# Patient Record
Sex: Female | Born: 1990 | Race: Black or African American | Hispanic: No | Smoking: Never smoker
Health system: Southern US, Community
[De-identification: ages and names within clinical notes are randomized; demographics above are authoritative.]

## PROBLEM LIST (undated history)

## (undated) ENCOUNTER — Inpatient Hospital Stay (HOSPITAL_COMMUNITY): Payer: Self-pay

## (undated) DIAGNOSIS — O039 Complete or unspecified spontaneous abortion without complication: Secondary | ICD-10-CM

## (undated) DIAGNOSIS — R030 Elevated blood-pressure reading, without diagnosis of hypertension: Secondary | ICD-10-CM

## (undated) DIAGNOSIS — Z789 Other specified health status: Secondary | ICD-10-CM

## (undated) HISTORY — DX: Elevated blood-pressure reading, without diagnosis of hypertension: R03.0

## (undated) HISTORY — PX: WISDOM TOOTH EXTRACTION: SHX21

## (undated) HISTORY — DX: Complete or unspecified spontaneous abortion without complication: O03.9

---
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
## 2010-04-26 ENCOUNTER — Inpatient Hospital Stay (HOSPITAL_COMMUNITY): Admission: AD | Admit: 2010-04-26 | Discharge: 2010-04-26 | Payer: Self-pay | Admitting: Obstetrics and Gynecology

## 2010-05-13 ENCOUNTER — Emergency Department (HOSPITAL_BASED_OUTPATIENT_CLINIC_OR_DEPARTMENT_OTHER): Admission: EM | Admit: 2010-05-13 | Discharge: 2010-05-13 | Payer: Self-pay | Admitting: Emergency Medicine

## 2010-06-19 ENCOUNTER — Inpatient Hospital Stay (HOSPITAL_COMMUNITY): Admission: AD | Admit: 2010-06-19 | Discharge: 2010-06-20 | Payer: Self-pay | Admitting: Obstetrics and Gynecology

## 2010-06-19 ENCOUNTER — Ambulatory Visit: Payer: Self-pay | Admitting: Family

## 2010-10-09 ENCOUNTER — Emergency Department (HOSPITAL_BASED_OUTPATIENT_CLINIC_OR_DEPARTMENT_OTHER)
Admission: EM | Admit: 2010-10-09 | Discharge: 2010-10-09 | Payer: Self-pay | Source: Home / Self Care | Admitting: Emergency Medicine

## 2010-10-27 ENCOUNTER — Inpatient Hospital Stay (HOSPITAL_COMMUNITY)
Admission: AD | Admit: 2010-10-27 | Discharge: 2010-10-27 | Payer: 59 | Source: Home / Self Care | Attending: Obstetrics and Gynecology | Admitting: Obstetrics and Gynecology

## 2010-10-27 LAB — URINALYSIS, ROUTINE W REFLEX MICROSCOPIC
Bilirubin Urine: NEGATIVE
Hemoglobin, Urine: NEGATIVE
Ketones, ur: NEGATIVE mg/dL
Nitrite: NEGATIVE
Protein, ur: NEGATIVE mg/dL
Specific Gravity, Urine: 1.015 (ref 1.005–1.030)
Urine Glucose, Fasting: NEGATIVE mg/dL
Urobilinogen, UA: 0.2 mg/dL (ref 0.0–1.0)
pH: 7 (ref 5.0–8.0)

## 2010-11-08 ENCOUNTER — Inpatient Hospital Stay (HOSPITAL_COMMUNITY)
Admission: AD | Admit: 2010-11-08 | Discharge: 2010-11-08 | Payer: Self-pay | Source: Home / Self Care | Attending: Obstetrics and Gynecology | Admitting: Obstetrics and Gynecology

## 2010-11-25 ENCOUNTER — Inpatient Hospital Stay (HOSPITAL_COMMUNITY)
Admission: AD | Admit: 2010-11-25 | Discharge: 2010-11-25 | Disposition: A | Discharge status: Home / Self Care | Payer: 59 | Source: Ambulatory Visit | Attending: Obstetrics and Gynecology | Admitting: Obstetrics and Gynecology

## 2010-11-25 DIAGNOSIS — O4700 False labor before 37 completed weeks of gestation, unspecified trimester: Secondary | ICD-10-CM | POA: Insufficient documentation

## 2010-11-29 ENCOUNTER — Inpatient Hospital Stay (HOSPITAL_COMMUNITY)
Admission: AD | Admit: 2010-11-29 | Discharge: 2010-11-30 | Disposition: A | Discharge status: Home / Self Care | Payer: 59 | Source: Ambulatory Visit | Attending: Obstetrics and Gynecology | Admitting: Obstetrics and Gynecology

## 2010-11-29 DIAGNOSIS — O4700 False labor before 37 completed weeks of gestation, unspecified trimester: Secondary | ICD-10-CM | POA: Insufficient documentation

## 2010-11-30 ENCOUNTER — Inpatient Hospital Stay (HOSPITAL_COMMUNITY)
Admission: AD | Admit: 2010-11-30 | Discharge: 2010-12-02 | DRG: 775 | Disposition: A | Discharge status: Home / Self Care | Payer: 59 | Source: Ambulatory Visit | Attending: Obstetrics and Gynecology | Admitting: Obstetrics and Gynecology

## 2010-11-30 LAB — CBC
HCT: 27.7 % — ABNORMAL LOW (ref 36.0–46.0)
Hemoglobin: 9.2 g/dL — ABNORMAL LOW (ref 12.0–15.0)
MCH: 29.3 pg (ref 26.0–34.0)
MCHC: 33.2 g/dL (ref 30.0–36.0)
MCV: 88.2 fL (ref 78.0–100.0)
Platelets: 195 10*3/uL (ref 150–400)
RBC: 3.14 MIL/uL — ABNORMAL LOW (ref 3.87–5.11)
RDW: 12.7 % (ref 11.5–15.5)
WBC: 10.1 10*3/uL (ref 4.0–10.5)

## 2010-12-01 LAB — CBC
HCT: 21.7 % — ABNORMAL LOW (ref 36.0–46.0)
Hemoglobin: 7.3 g/dL — ABNORMAL LOW (ref 12.0–15.0)
MCH: 29.2 pg (ref 26.0–34.0)
MCHC: 33.6 g/dL (ref 30.0–36.0)
MCV: 86.8 fL (ref 78.0–100.0)
Platelets: 157 10*3/uL (ref 150–400)
RBC: 2.5 MIL/uL — ABNORMAL LOW (ref 3.87–5.11)
RDW: 12.7 % (ref 11.5–15.5)
WBC: 15.9 10*3/uL — ABNORMAL HIGH (ref 4.0–10.5)

## 2010-12-01 LAB — RPR: RPR Ser Ql: NONREACTIVE

## 2011-01-06 LAB — URINALYSIS, ROUTINE W REFLEX MICROSCOPIC
Bilirubin Urine: NEGATIVE
Glucose, UA: NEGATIVE mg/dL
Hgb urine dipstick: NEGATIVE
Ketones, ur: NEGATIVE mg/dL
Nitrite: NEGATIVE
Protein, ur: NEGATIVE mg/dL
Specific Gravity, Urine: 1.015 (ref 1.005–1.030)
Urobilinogen, UA: 0.2 mg/dL (ref 0.0–1.0)
pH: 7.5 (ref 5.0–8.0)

## 2011-01-06 LAB — URINE MICROSCOPIC-ADD ON

## 2011-01-07 LAB — BASIC METABOLIC PANEL
BUN: 9 mg/dL (ref 6–23)
CO2: 22 mEq/L (ref 19–32)
Calcium: 9.5 mg/dL (ref 8.4–10.5)
Chloride: 106 mEq/L (ref 96–112)
Creatinine, Ser: 0.7 mg/dL (ref 0.4–1.2)
GFR calc Af Amer: >60 mL/min (ref >60)
GFR calc non Af Amer: >60 mL/min (ref >60)
Glucose, Bld: 78 mg/dL (ref 70–99)
Potassium: 4 mEq/L (ref 3.5–5.1)
Sodium: 141 mEq/L (ref 135–145)

## 2011-01-07 LAB — URINE MICROSCOPIC-ADD ON

## 2011-01-07 LAB — PREGNANCY, URINE: Preg Test, Ur: POSITIVE

## 2011-01-07 LAB — URINALYSIS, ROUTINE W REFLEX MICROSCOPIC
Bilirubin Urine: NEGATIVE
Glucose, UA: NEGATIVE mg/dL
Hgb urine dipstick: NEGATIVE
Ketones, ur: 15 mg/dL — AB
Nitrite: NEGATIVE
Protein, ur: NEGATIVE mg/dL
Specific Gravity, Urine: 1.03 (ref 1.005–1.030)
Urobilinogen, UA: 1 mg/dL (ref 0.0–1.0)
pH: 7 (ref 5.0–8.0)

## 2011-01-08 LAB — URINALYSIS, ROUTINE W REFLEX MICROSCOPIC
Bilirubin Urine: NEGATIVE
Glucose, UA: NEGATIVE mg/dL
Ketones, ur: NEGATIVE mg/dL
Nitrite: NEGATIVE
Protein, ur: NEGATIVE mg/dL
Specific Gravity, Urine: 1.02 (ref 1.005–1.030)
Urobilinogen, UA: 0.2 mg/dL (ref 0.0–1.0)
pH: 7.5 (ref 5.0–8.0)

## 2011-01-08 LAB — URINE CULTURE: Colony Count: 75000

## 2011-01-08 LAB — CBC
HCT: 39.8 % (ref 36.0–46.0)
MCHC: 34 g/dL (ref 30.0–36.0)
MCV: 93.3 fL (ref 78.0–100.0)
Platelets: 184 10*3/uL (ref 150–400)
RDW: 13.2 % (ref 11.5–15.5)
WBC: 3.9 10*3/uL — ABNORMAL LOW (ref 4.0–10.5)

## 2011-01-08 LAB — URINE MICROSCOPIC-ADD ON

## 2011-01-08 LAB — POCT PREGNANCY, URINE: Preg Test, Ur: POSITIVE

## 2011-02-07 LAB — URINALYSIS, ROUTINE W REFLEX MICROSCOPIC
Bilirubin Urine: NEGATIVE
Glucose, UA: NEGATIVE mg/dL
Ketones, ur: NEGATIVE mg/dL
Protein, ur: NEGATIVE mg/dL
pH: 7.5 (ref 5.0–8.0)

## 2011-03-11 ENCOUNTER — Inpatient Hospital Stay (HOSPITAL_COMMUNITY)
Admission: EM | Admit: 2011-03-11 | Discharge: 2011-03-11 | Disposition: A | Discharge status: Home / Self Care | Payer: Medicaid Other | Source: Ambulatory Visit | Attending: Obstetrics and Gynecology | Admitting: Obstetrics and Gynecology

## 2011-03-11 DIAGNOSIS — N938 Other specified abnormal uterine and vaginal bleeding: Secondary | ICD-10-CM

## 2011-03-11 DIAGNOSIS — N949 Unspecified condition associated with female genital organs and menstrual cycle: Secondary | ICD-10-CM

## 2012-08-15 ENCOUNTER — Emergency Department (INDEPENDENT_AMBULATORY_CARE_PROVIDER_SITE_OTHER)
Admission: EM | Admit: 2012-08-15 | Discharge: 2012-08-15 | Disposition: A | Discharge status: Home / Self Care | Payer: BC Managed Care – PPO | Source: Home / Self Care | Attending: Emergency Medicine | Admitting: Emergency Medicine

## 2012-08-15 ENCOUNTER — Encounter (HOSPITAL_COMMUNITY): Payer: Self-pay | Admitting: *Deleted

## 2012-08-15 DIAGNOSIS — J069 Acute upper respiratory infection, unspecified: Secondary | ICD-10-CM

## 2012-08-15 MED ORDER — PSEUDOEPHEDRINE HCL ER 120 MG PO TB12: 120.0000 mg | ORAL_TABLET | Freq: Two times a day (BID) | ORAL | Status: DC

## 2012-08-15 MED ORDER — LORATADINE 10 MG PO TABS: 10.0000 mg | ORAL_TABLET | Freq: Every day | ORAL | Status: DC

## 2012-08-15 NOTE — ED Notes (Signed)
pT  HAS  SYMPTOMS  OF  SORETHROAT  AS  WELL  AS  COUGH  CONGESTION   BODY  ACHES  -  PT  REPORTS  SHE  HAD  SOME  CHILLS  YEST  BUT  NO  FEVER  TODAY      -  SHE  REPORTS  HAD  SYMPTOMS     SEVERAL WEEKS  AGO     GOT  BETTER  THAN RESUMED  3  DAYS  AGO

## 2012-08-15 NOTE — ED Provider Notes (Signed)
History    PCP=[facility], at Urgent care CSN: [id number]  Arrival date & time 08/15/12  6578   First MD Initiated Contact with Patient 08/15/12 [phone]      No chief complaint on file.  HPI 21 yr old female awoke this morning with a sore throat, cough and feeling bad.  It actually started 3 weeks ago, with myalgias, cough, runny nose, fever chills (not recorded)-took OTC robitussin + and Nyquil and this abated.  This returned Mon 21st with similar symptoms-initally a cough.  Has a 21 yr old at home who goes to daycare, but she hasn;t been sick-no one in her care is sick.  Patent works at a call center. Her main concern was that the sore throat was bugginh her.    No past medical history on file.  No past surgical history on file.  No family history on file.  History  Substance Use Topics  . Smoking status: Not on file  . Smokeless tobacco: Not on file  . Alcohol Use: Not on file    OB History    No data available      Review of Systems No diarrhoea, no vomti, +throat pan.,some nasal congestion  + allergies Dec appetite, no vomit,  SOB when she coughs   Allergies  Review of patient's allergies indicates not on file.  Home Medications  No current outpatient prescriptions on file.  There were no vitals taken for this visit.  Physical Exam Alert pleasant oriented African American female Fundus crisp TMs bilaterally are red with clear fluid behind them No submandibular lymphadenopathy however there is some tenderness on the left and right submandibular regions on pressing. Throat is red injected without any tonsillar hypertrophy, good dentition S1-S2 no murmur rub or gallop slightly tachycardic Chest is clinically clear with no tactile vocal resonance or fremitus Abdomen is soft nontender nondistended no rebound bowel sounds are increased no CVA tenderness Motor is grossly intact with good range of motion to all joints   ED Course  Procedures (including critical care  time)  Labs Reviewed - No data to display No results found.   No diagnosis found.    MDM  21 year old [place] American female presents with fever malleus this or throat  Sore throat-cento criteria on this lady is 2-would send cultures as well Have ordered a rapid strep test. If this is positive we will treat with antibiotics-she has a sulfa allergy. I will reassess his with her. She needs to find a primary care physician. 10:50 AM   Her strep test has returned negative, patient requests a note for being out of work for the next day-she requests rendered rhinorrhea medication and decongestant strength  10:51 AM           Rhetta Mura, MD 08/15/12 1051

## 2012-08-16 LAB — STREP A DNA PROBE: Group A Strep Probe: NEGATIVE

## 2012-10-19 ENCOUNTER — Emergency Department (HOSPITAL_COMMUNITY)
Admission: EM | Admit: 2012-10-19 | Discharge: 2012-10-19 | Disposition: A | Discharge status: Home / Self Care | Payer: BC Managed Care – PPO | Attending: Emergency Medicine | Admitting: Emergency Medicine

## 2012-10-19 ENCOUNTER — Encounter (HOSPITAL_COMMUNITY): Payer: Self-pay | Admitting: Emergency Medicine

## 2012-10-19 DIAGNOSIS — R0982 Postnasal drip: Secondary | ICD-10-CM | POA: Insufficient documentation

## 2012-10-19 DIAGNOSIS — R059 Cough, unspecified: Secondary | ICD-10-CM | POA: Insufficient documentation

## 2012-10-19 DIAGNOSIS — R063 Periodic breathing: Secondary | ICD-10-CM | POA: Insufficient documentation

## 2012-10-19 DIAGNOSIS — J019 Acute sinusitis, unspecified: Secondary | ICD-10-CM

## 2012-10-19 DIAGNOSIS — J3489 Other specified disorders of nose and nasal sinuses: Secondary | ICD-10-CM | POA: Insufficient documentation

## 2012-10-19 DIAGNOSIS — J018 Other acute sinusitis: Secondary | ICD-10-CM | POA: Insufficient documentation

## 2012-10-19 DIAGNOSIS — R05 Cough: Secondary | ICD-10-CM | POA: Insufficient documentation

## 2012-10-19 MED ORDER — AMOXICILLIN-POT CLAVULANATE 875-125 MG PO TABS: 1.0000 | ORAL_TABLET | Freq: Two times a day (BID) | ORAL | Status: DC

## 2012-10-19 MED ORDER — HYDROCOD POLST-CHLORPHEN POLST 10-8 MG/5ML PO LQCR: 5.0000 mL | Freq: Two times a day (BID) | ORAL | Status: DC | PRN

## 2012-10-19 NOTE — ED Provider Notes (Signed)
History     CSN: [id number]  Arrival date & time 10/19/12  1919   First MD Initiated Contact with Patient 10/19/12 2128      Chief Complaint  Patient presents with  . Otalgia    (Consider location/radiation/quality/duration/timing/severity/associated sxs/prior treatment) HPI Comments: Patient presents with left sinus pressure, left ear pain, rhinorrhea, nasal congestion, and low grade fever.  She reports that her symptoms have been present for the past 3 days.  She states that she was seen by her PCP for similar symptoms a month ago.  Her symptoms then improved and now have returned.  She has not taken anything prior to arrival in the ED.  She denies HA, sore throat, or body aches.    Patient is a 21 y.o. female presenting with ear pain. The history is provided by the patient.  Otalgia Associated symptoms include rhinorrhea and cough. Pertinent negatives include no sore throat, no abdominal pain, no diarrhea, no vomiting, no neck pain and no rash.    History reviewed. No pertinent past medical history.  History reviewed. No pertinent past surgical history.  Family History  Problem Relation Age of Onset  . Hypertension Other   . Diabetes Other     History  Substance Use Topics  . Smoking status: Never Smoker   . Smokeless tobacco: Not on file  . Alcohol Use: No    OB History    Grav Para Term Preterm Abortions TAB SAB Ect Mult Living                  Review of Systems  Constitutional: Positive for chills.       Low grade fever  HENT: Positive for ear pain, congestion, rhinorrhea, postnasal drip and sinus pressure. Negative for sore throat, trouble swallowing, neck pain and voice change.   Respiratory: Positive for cough. Negative for shortness of breath and wheezing.   Gastrointestinal: Negative for nausea, vomiting, abdominal pain and diarrhea.  Skin: Negative for rash.    Allergies  Sulfa antibiotics  Home Medications   Current Outpatient Rx  Name  Route   Sig  Dispense  Refill  . BROMPHENIRAMINE-PSEUDOEPH 1-15 MG/5ML PO ELIX   Oral   Take 5 mLs by mouth 2 (two) times daily as needed. For congestion or cold symptoms         . PSEUDOEPHEDRINE-GUAIFENESIN ER 60-600 MG PO TB12   Oral   Take 1 tablet by mouth every 12 (twelve) hours as needed. For congestion           BP 141/96  Pulse 104  Temp 99.6 F (37.6 C) (Oral)  Resp 16  SpO2 100%  Physical Exam  Nursing note and vitals reviewed. Constitutional: She appears well-developed and well-nourished. No distress.  HENT:  Head: Normocephalic and atraumatic.  Right Ear: Tympanic membrane and ear canal normal.  Left Ear: Tympanic membrane and ear canal normal.  Nose: Mucosal edema and rhinorrhea present. Right sinus exhibits no maxillary sinus tenderness and no frontal sinus tenderness. Left sinus exhibits maxillary sinus tenderness and frontal sinus tenderness.  Mouth/Throat: Uvula is midline, oropharynx is clear and moist and mucous membranes are normal.  Neck: Normal range of motion. Neck supple.  Cardiovascular: Normal rate, regular rhythm and normal heart sounds.   Pulmonary/Chest: Effort normal and breath sounds normal. No respiratory distress. She has no wheezes. She has no rales.  Neurological: She is alert.  Skin: Skin is warm and dry. No rash noted. She is not diaphoretic.  Psychiatric:  She has a normal mood and affect.    ED Course  Procedures (including critical care time)  Labs Reviewed - No data to display No results found.   No diagnosis found.    MDM  Signs and symptoms consistent with Acute Sinusitis.  Patient discharged home with antibiotic therapy and instructed to take a Decongestant.          Pascal Lux Milfay, PA-C 10/20/12 2021

## 2012-10-19 NOTE — ED Notes (Signed)
Pt is c/o earache on the left side  Pt states pain radiates to her temple and the left side of her face  Pt states pain started 2 to 3 days ago but got worse today  Pt is also c/o cough, productive with yellow sputum

## 2012-10-20 NOTE — ED Provider Notes (Signed)
Medical screening examination/treatment/procedure(s) were performed by non-physician practitioner and as supervising physician I was immediately available for consultation/collaboration.  Marwan T Powers, MD 10/20/12 2222 

## 2013-09-17 ENCOUNTER — Emergency Department (HOSPITAL_COMMUNITY)
Admission: EM | Admit: 2013-09-17 | Discharge: 2013-09-17 | Disposition: A | Discharge status: Home / Self Care | Payer: Self-pay

## 2015-05-29 ENCOUNTER — Encounter (HOSPITAL_COMMUNITY): Payer: Self-pay | Admitting: Family Medicine

## 2015-05-29 ENCOUNTER — Emergency Department (HOSPITAL_COMMUNITY)
Admission: EM | Admit: 2015-05-29 | Discharge: 2015-05-29 | Disposition: A | Discharge status: Home / Self Care | Payer: Self-pay | Attending: Emergency Medicine | Admitting: Emergency Medicine

## 2015-05-29 DIAGNOSIS — R51 Headache: Secondary | ICD-10-CM | POA: Insufficient documentation

## 2015-05-29 DIAGNOSIS — R519 Headache, unspecified: Secondary | ICD-10-CM

## 2015-05-29 MED ORDER — METOCLOPRAMIDE HCL 5 MG/ML IJ SOLN: 10.0000 mg | Freq: Once | INTRAMUSCULAR | Status: DC

## 2015-05-29 MED ORDER — DIPHENHYDRAMINE HCL 50 MG/ML IJ SOLN: 25.0000 mg | Freq: Once | INTRAMUSCULAR | Status: DC

## 2015-05-29 MED ORDER — SODIUM CHLORIDE 0.9 % IV BOLUS (SEPSIS): 1000.0000 mL | Freq: Once | INTRAVENOUS | Status: DC

## 2015-05-29 MED ORDER — IBUPROFEN 400 MG PO TABS
600.0000 mg | ORAL_TABLET | Freq: Once | ORAL | Status: AC
  Administered 2015-05-29: 600 mg via ORAL
  Filled 2015-05-29 (×2): qty 1

## 2015-05-29 MED ORDER — KETOROLAC TROMETHAMINE 30 MG/ML IJ SOLN: 30.0000 mg | Freq: Once | INTRAMUSCULAR | Status: DC

## 2015-05-29 NOTE — ED Notes (Signed)
Pt here for left sided facial pain radiating into head and neck x 1 week.

## 2015-05-29 NOTE — ED Notes (Signed)
The pt does not want the iv meds she wants rxs instaed

## 2015-05-29 NOTE — ED Provider Notes (Signed)
CSN: [id number]     Arrival date & time 05/29/15  1728 History   First MD Initiated Contact with Patient 05/29/15 1903     Chief Complaint  Patient presents with  . Facial Pain     (Consider location/radiation/quality/duration/timing/severity/associated sxs/prior Treatment) HPI  24 year old female presents with intermittent left-sided headache for the past 1 week. She was in a grocery store and had sweating and so a bystander told her she might be having a stroke and told her to go to the ER. The patient's headache has not been worsening over this week and seems to come and go every couple hours. It is a sharp pain that seems to started in her temple and moves diffusely through her left scalp as well as her left face. No facial swelling. No congestion or rhinorrhea. Fevers, blurry vision, nausea, or vomiting. No neck pain or stiffness. Has not taken anything for the pain. Rates her pain as severe currently. No prior history of headaches like this.  History reviewed. No pertinent past medical history. History reviewed. No pertinent past surgical history. Family History  Problem Relation Age of Onset  . Hypertension Other   . Diabetes Other    History  Substance Use Topics  . Smoking status: Never Smoker   . Smokeless tobacco: Not on file  . Alcohol Use: No   OB History    No data available     Review of Systems  Constitutional: Negative for fever.  Eyes: Negative for visual disturbance.  Gastrointestinal: Negative for nausea and vomiting.  Musculoskeletal: Negative for neck pain and neck stiffness.  Neurological: Positive for headaches. Negative for dizziness, weakness and numbness.  All other systems reviewed and are negative.     Allergies  Sulfa antibiotics  Home Medications   Prior to Admission medications   Medication Sig Start Date End Date Taking? Authorizing Provider  amoxicillin-clavulanate (AUGMENTIN) 875-125 MG per tablet Take 1 tablet by mouth 2 (two) times  daily. Patient not taking: Reported on 05/29/2015 10/19/12   Santiago Glad, PA-C  chlorpheniramine-HYDROcodone (TUSSIONEX PENNKINETIC ER) 10-8 MG/5ML LQCR Take 5 mLs by mouth every 12 (twelve) hours as needed. Patient not taking: Reported on 05/29/2015 10/19/12   Heather Laisure, PA-C   BP 106/63 mmHg  Pulse 83  Temp(Src) 98.4 F (36.9 C) (Oral)  Resp 21  Ht 5\' 2"  (1.575 m)  Wt 118 lb (53.524 kg)  BMI 21.58 kg/m2  SpO2 100% Physical Exam  Constitutional: She is oriented to person, place, and time. She appears well-developed and well-nourished.  HENT:  Head: Normocephalic and atraumatic.    Right Ear: External ear normal.  Left Ear: External ear normal.  Nose: Nose normal.  No facial swelling or tenderness  Eyes: Right eye exhibits no discharge. Left eye exhibits no discharge.  Cardiovascular: Normal rate, regular rhythm and normal heart sounds.   Pulmonary/Chest: Effort normal and breath sounds normal.  Abdominal: Soft. There is no tenderness.  Neurological: She is alert and oriented to person, place, and time.  CN 2-12 grossly intact. 5/5 strength in all 4 extremities. Normal gross sensation. Normal finger to nose  Skin: Skin is warm and dry.  Nursing note and vitals reviewed.   ED Course  Procedures (including critical care time) Labs Review Labs Reviewed - No data to display  Imaging Review No results found.   EKG Interpretation None      MDM   Final diagnoses:  Left-sided headache    Patient with an intermittent headache for 1  week. No red flags such as sudden onset, neck stiffness, meningismus, fever, or neuro deficits. Appears quite comfortable at this time. Has diffuse tenderness likely scarring from a musculoskeletal etiology. I highly doubt stroke, subarachnoid hemorrhage, intracranial bleeding, meningitis/encephalitis, or other acute intracranial issue. Discussed treatment in the ER, after initial agreement she does not want IV treatment once ibuprofen and  discharge. I feel this is reasonable, she has a PCP who I recommended she follow-up with closely and may need further testing as an outpatient.    Pricilla Loveless, MD 05/29/15 [phone]

## 2015-07-01 ENCOUNTER — Encounter (HOSPITAL_COMMUNITY): Payer: Self-pay | Admitting: Emergency Medicine

## 2015-07-01 ENCOUNTER — Emergency Department (INDEPENDENT_AMBULATORY_CARE_PROVIDER_SITE_OTHER)
Admission: EM | Admit: 2015-07-01 | Discharge: 2015-07-01 | Disposition: A | Discharge status: Home / Self Care | Payer: Self-pay | Source: Home / Self Care | Attending: Family Medicine | Admitting: Family Medicine

## 2015-07-01 DIAGNOSIS — R6889 Other general symptoms and signs: Secondary | ICD-10-CM

## 2015-07-01 MED ORDER — IBUPROFEN 600 MG PO TABS: 600.0000 mg | ORAL_TABLET | Freq: Four times a day (QID) | ORAL | Status: DC | PRN

## 2015-07-01 MED ORDER — ONDANSETRON HCL 4 MG PO TABS: 8.0000 mg | ORAL_TABLET | Freq: Three times a day (TID) | ORAL | Status: DC | PRN

## 2015-07-01 MED ORDER — OSELTAMIVIR PHOSPHATE 75 MG PO CAPS: 75.0000 mg | ORAL_CAPSULE | Freq: Two times a day (BID) | ORAL | Status: DC

## 2015-07-01 NOTE — ED Provider Notes (Signed)
CSN: [id number]     Arrival date & time 07/01/15  1850 History   First MD Initiated Contact with Patient 07/01/15 1946     Chief Complaint  Patient presents with  . URI   (Consider location/radiation/quality/duration/timing/severity/associated sxs/prior Treatment) HPI  1 day of generalized body aches, fevers, fatigue. Symptoms are constant and getting worse. Has not tried anything for her symptoms. Denies any shortness of breath, chest palpitations, chest pain, nausea, vomiting, diarrhea, constipation, sore throat. Associated with headache.  History reviewed. No pertinent past medical history. History reviewed. No pertinent past surgical history. Family History  Problem Relation Age of Onset  . Hypertension Other   . Diabetes Other    Social History  Substance Use Topics  . Smoking status: Never Smoker   . Smokeless tobacco: None  . Alcohol Use: No   OB History    No data available     Review of Systems Per HPI with all other pertinent systems negative.   Allergies  Sulfa antibiotics  Home Medications   Prior to Admission medications   Medication Sig Start Date End Date Taking? Authorizing Provider  amoxicillin-clavulanate (AUGMENTIN) 875-125 MG per tablet Take 1 tablet by mouth 2 (two) times daily. Patient not taking: Reported on 05/29/2015 10/19/12   Santiago Glad, PA-C  chlorpheniramine-HYDROcodone (TUSSIONEX PENNKINETIC ER) 10-8 MG/5ML LQCR Take 5 mLs by mouth every 12 (twelve) hours as needed. Patient not taking: Reported on 05/29/2015 10/19/12   Santiago Glad, PA-C  ibuprofen (ADVIL,MOTRIN) 600 MG tablet Take 1 tablet (600 mg total) by mouth every 6 (six) hours as needed. 07/01/15   Ozella Rocks, MD  ondansetron (ZOFRAN) 4 MG tablet Take 2 tablets (8 mg total) by mouth every 8 (eight) hours as needed for nausea or vomiting. 07/01/15   Ozella Rocks, MD  oseltamivir (TAMIFLU) 75 MG capsule Take 1 capsule (75 mg total) by mouth 2 (two) times daily. 07/01/15   Ozella Rocks, MD   Meds Ordered and Administered this Visit  Medications - No data to display  BP 124/89 mmHg  Pulse 97  Temp(Src) 99.8 F (37.7 C) (Oral)  Resp 16  SpO2 100% No data found.   Physical Exam Physical Exam  Constitutional: oriented to person, place, and time. appears well-developed and well-nourished. No distress.  HENT:  Head: Normocephalic and atraumatic.  Eyes: EOMI. PERRL.  Neck: Normal range of motion.  Cardiovascular: RRR, no m/r/g, 2+ distal pulses,  Pulmonary/Chest: Effort normal and breath sounds normal. No respiratory distress.  Abdominal: Soft. Bowel sounds are normal. NonTTP, no distension.  Musculoskeletal: Mild chest wall tenderness to palpation.  Neurological: alert and oriented to person, place, and time.  Skin: Skin is warm. No rash noted. non diaphoretic.  Psychiatric: normal mood and affect. behavior is normal. Judgment and thought content normal.   ED Course  Procedures (including critical care time)  Labs Review Labs Reviewed - No data to display  Imaging Review No results found.   Visual Acuity Review  Right Eye Distance:   Left Eye Distance:   Bilateral Distance:    Right Eye Near:   Left Eye Near:    Bilateral Near:         MDM   1. Flu-like symptoms    Tamiflu, Motrin, Zofran    Ozella Rocks, MD 07/01/15 [phone]

## 2015-07-01 NOTE — Discharge Instructions (Signed)
You likely have the flu. Please start the Tamiflu as prescribed. Please use Zofran for nausea. Please use the ibuprofen for general pain relief. Please stay well-hydrated and get plenty of rest.

## 2015-07-01 NOTE — ED Notes (Signed)
Patient c/o chest pain, with fatigue, cold intolerance and headache onset yesterday. Patient is concerned about pneumonia, she has taken airborne and hot tea with no relief. Patient is in NAD.

## 2015-07-07 ENCOUNTER — Encounter (HOSPITAL_COMMUNITY): Payer: Self-pay

## 2015-07-07 ENCOUNTER — Inpatient Hospital Stay (HOSPITAL_COMMUNITY)
Admission: AD | Admit: 2015-07-07 | Discharge: 2015-07-07 | Disposition: A | Discharge status: Home / Self Care | Payer: Self-pay | Source: Ambulatory Visit | Attending: Obstetrics and Gynecology | Admitting: Obstetrics and Gynecology

## 2015-07-07 DIAGNOSIS — N7091 Salpingitis, unspecified: Secondary | ICD-10-CM | POA: Insufficient documentation

## 2015-07-07 HISTORY — DX: Other specified health status: Z78.9

## 2015-07-07 MED ORDER — CEFTRIAXONE SODIUM 1 G IJ SOLR
1.0000 g | Freq: Once | INTRAMUSCULAR | Status: AC
  Administered 2015-07-07: 1 g via INTRAMUSCULAR
  Filled 2015-07-07: qty 10

## 2015-07-07 MED ORDER — HYDROMORPHONE HCL 1 MG/ML IJ SOLN: 1.0000 mg | Freq: Once | INTRAMUSCULAR | Status: DC

## 2015-07-07 MED ORDER — CEFTRIAXONE SODIUM 1 G IJ SOLR
1.0000 g | Freq: Once | INTRAMUSCULAR | Status: DC
  Filled 2015-07-07: qty 10

## 2015-07-07 MED ORDER — LACTATED RINGERS IV BOLUS (SEPSIS): 1000.0000 mL | Freq: Once | INTRAVENOUS | Status: DC

## 2015-07-07 NOTE — Discharge Instructions (Signed)
Pelvic Inflammatory Disease °Pelvic inflammatory disease (PID) refers to an infection in some or all of the female organs. The infection can be in the uterus, ovaries, fallopian tubes, or the surrounding tissues in the pelvis. PID can cause abdominal or pelvic pain that comes on suddenly (acute pelvic pain). PID is a serious infection because it can lead to lasting (chronic) pelvic pain or the inability to have children (infertile).  °CAUSES  °The infection is often caused by the normal bacteria found in the vaginal tissues. PID may also be caused by an infection that is spread during sexual contact. PID can also occur following:  °· The birth of a baby.   °· A miscarriage.   °· An abortion.   °· Major pelvic surgery.   °· The use of an intrauterine device (IUD).   °· A sexual assault.   °RISK FACTORS °Certain factors can put a person at higher risk for PID, such as: °· Being younger than 25 years. °· Being sexually active at a young age. °· Using nonbarrier contraception. °· Having multiple sexual partners. °· Having sex with someone who has symptoms of a genital infection. °· Using oral contraception. °Other times, certain behaviors can increase the possibility of getting PID, such as: °· Having sex during your period. °· Using a vaginal douche. °· Having an intrauterine device (IUD) in place. °SYMPTOMS  °· Abdominal or pelvic pain.   °· Fever.   °· Chills.   °· Abnormal vaginal discharge. °· Abnormal uterine bleeding.   °· Unusual pain shortly after finishing your period. °DIAGNOSIS  °Your caregiver will choose some of the following methods to make a diagnosis, such as:  °· Performing a physical exam and history. A pelvic exam typically reveals a very tender uterus and surrounding pelvis.   °· Ordering laboratory tests including a pregnancy test, blood tests, and urine test.  °· Ordering cultures of the vagina and cervix to check for a sexually transmitted infection (STI). °· Performing an ultrasound.    °· Performing a laparoscopic procedure to look inside the pelvis.   °TREATMENT  °· Antibiotic medicines may be prescribed and taken by mouth.   °· Sexual partners may be treated when the infection is caused by a sexually transmitted disease (STD).   °· Hospitalization may be needed to give antibiotics intravenously. °· Surgery may be needed, but this is rare. °It may take weeks until you are completely well. If you are diagnosed with PID, you should also be checked for human immunodeficiency virus (HIV).   °HOME CARE INSTRUCTIONS  °· If given, take your antibiotics as directed. Finish the medicine even if you start to feel better.   °· Only take over-the-counter or prescription medicines for pain, discomfort, or fever as directed by your caregiver.   °· Do not have sexual intercourse until treatment is completed or as directed by your caregiver. If PID is confirmed, your recent sexual partner(s) will need treatment.   °· Keep your follow-up appointments. °SEEK MEDICAL CARE IF:  °· You have increased or abnormal vaginal discharge.   °· You need prescription medicine for your pain.   °· You vomit.   °· You cannot take your medicines.   °· Your partner has an STD.   °SEEK IMMEDIATE MEDICAL CARE IF:  °· You have a fever.   °· You have increased abdominal or pelvic pain.   °· You have chills.   °· You have pain when you urinate.   °· You are not better after 72 hours following treatment.   °MAKE SURE YOU:  °· Understand these instructions. °· Will watch your condition. °· Will get help right away if you are not doing well or get worse. °  Document Released: 10/09/2005 Document Revised: 02/03/2013 Document Reviewed: 10/05/2011 °[employer]® Patient Information ©2015 [employer]. This information is not intended to replace advice given to you by your health care provider. Make sure you discuss any questions you have with your health care provider. ° °

## 2015-07-07 NOTE — MAU Note (Signed)
Patient's IV attempted x 2 offered to call anesthesia to see if they can obtain IV access, patient opted to have Rocephin IM instead, refused lab to draw her bloodwork, refused Dilaudid IM.

## 2015-07-07 NOTE — MAU Note (Signed)
Called Dr. Henderson Cloud concerning reason for patient's visit, received orders.

## 2015-07-07 NOTE — MAU Note (Signed)
Pt presents to MAU for IV antibiotics for infection in her left tube. Dr Henderson Cloud sent pt over from his office

## 2015-07-07 NOTE — MAU Note (Signed)
Spoke with Dr. Henderson Cloud informed could not obtain IV access, patient consented to receive IM Rocephin, refused bloodwork, will get prescription for Vicodin filled. Per Dr. Henderson Cloud follow up in office on Friday.

## 2015-11-04 ENCOUNTER — Encounter (HOSPITAL_COMMUNITY): Payer: Self-pay | Admitting: *Deleted

## 2015-11-04 ENCOUNTER — Inpatient Hospital Stay (HOSPITAL_COMMUNITY)
Admission: AD | Admit: 2015-11-04 | Discharge: 2015-11-04 | Disposition: A | Discharge status: Home / Self Care | Payer: 59 | Source: Ambulatory Visit | Attending: Obstetrics & Gynecology | Admitting: Obstetrics & Gynecology

## 2015-11-04 DIAGNOSIS — K219 Gastro-esophageal reflux disease without esophagitis: Secondary | ICD-10-CM

## 2015-11-04 DIAGNOSIS — K2970 Gastritis, unspecified, without bleeding: Secondary | ICD-10-CM | POA: Diagnosis not present

## 2015-11-04 DIAGNOSIS — R109 Unspecified abdominal pain: Secondary | ICD-10-CM | POA: Diagnosis present

## 2015-11-04 LAB — COMPREHENSIVE METABOLIC PANEL
ALT: 17 U/L (ref 14–54)
AST: 21 U/L (ref 15–41)
Albumin: 4 g/dL (ref 3.5–5.0)
Alkaline Phosphatase: 49 U/L (ref 38–126)
Anion gap: 10 (ref 5–15)
BUN: 11 mg/dL (ref 6–20)
CALCIUM: 9.2 mg/dL (ref 8.9–10.3)
CHLORIDE: 104 mmol/L (ref 101–111)
CO2: 24 mmol/L (ref 22–32)
CREATININE: 0.65 mg/dL (ref 0.44–1.00)
GFR calc Af Amer: >60 mL/min (ref >60)
Glucose, Bld: 89 mg/dL (ref 65–99)
Potassium: 4 mmol/L (ref 3.5–5.1)
SODIUM: 138 mmol/L (ref 135–145)
Total Bilirubin: 0.5 mg/dL (ref 0.3–1.2)
Total Protein: 7.5 g/dL (ref 6.5–8.1)

## 2015-11-04 LAB — URINALYSIS, ROUTINE W REFLEX MICROSCOPIC
BILIRUBIN URINE: NEGATIVE
GLUCOSE, UA: NEGATIVE mg/dL
HGB URINE DIPSTICK: NEGATIVE
KETONES UR: NEGATIVE mg/dL
NITRITE: NEGATIVE
PH: 7.5 (ref 5.0–8.0)
Protein, ur: NEGATIVE mg/dL
SPECIFIC GRAVITY, URINE: 1.01 (ref 1.005–1.030)

## 2015-11-04 LAB — CBC WITH DIFFERENTIAL/PLATELET
Basophils Absolute: 0 10*3/uL (ref 0.0–0.1)
Basophils Relative: 0 %
EOS ABS: 0.1 10*3/uL (ref 0.0–0.7)
EOS PCT: 1 %
HCT: 39 % (ref 36.0–46.0)
Hemoglobin: 13.2 g/dL (ref 12.0–15.0)
LYMPHS ABS: 2.6 10*3/uL (ref 0.7–4.0)
LYMPHS PCT: 50 %
MCH: 30.9 pg (ref 26.0–34.0)
MCHC: 33.8 g/dL (ref 30.0–36.0)
MCV: 91.3 fL (ref 78.0–100.0)
MONO ABS: 0.4 10*3/uL (ref 0.1–1.0)
MONOS PCT: 7 %
Neutro Abs: 2.2 10*3/uL (ref 1.7–7.7)
Neutrophils Relative %: 42 %
PLATELETS: 214 10*3/uL (ref 150–400)
RBC: 4.27 MIL/uL (ref 3.87–5.11)
RDW: 13.9 % (ref 11.5–15.5)
WBC: 5.3 10*3/uL (ref 4.0–10.5)

## 2015-11-04 LAB — POCT PREGNANCY, URINE: Preg Test, Ur: NEGATIVE

## 2015-11-04 LAB — URINE MICROSCOPIC-ADD ON: BACTERIA UA: NONE SEEN

## 2015-11-04 MED ORDER — FAMOTIDINE 20 MG PO TABS: 20.0000 mg | ORAL_TABLET | Freq: Once | ORAL | Status: DC

## 2015-11-04 MED ORDER — GI COCKTAIL ~~LOC~~
30.0000 mL | Freq: Once | ORAL | Status: DC
  Filled 2015-11-04: qty 30

## 2015-11-04 MED ORDER — FAMOTIDINE 20 MG PO TABS: 20.0000 mg | ORAL_TABLET | Freq: Two times a day (BID) | ORAL | Status: DC

## 2015-11-04 NOTE — MAU Provider Note (Signed)
History     CSN: [id number]  Arrival date and [id number]: 11/04/15 [id number]   First Provider Initiated Contact with Patient 11/04/15 1845      Chief Complaint  Patient presents with  . Abdominal Pain   HPI  [name] is a 25 y.o. [id number] who presents to MAU today with complaint of left sided abdominal pain. The patient states that pain started last night. She has not taken anything for pain. She states that pain is only with sitting. She rates pain at 6/10 now. She states that she is worried she has another "infection in her tube." The patient was seen in MAU in September for IM Rocephin to treat PID. She denies fever, N/V/D or constipation, vaginal bleeding or discharge today. She states unsure LMP as she has recently discontinued Depo Provera. She had spotting in December. She does endorse increased heartburn recently. She has not tried any medications for this.   OB History    Gravida Para Term Preterm AB TAB SAB Ectopic Multiple Living   1 1 1  0 0 0 0 0 0 1      Past Medical History  Diagnosis Date  . Medical history non-contributory     Past Surgical History  Procedure Laterality Date  . No past surgeries      Family History  Problem Relation Age of Onset  . Hypertension Other   . Diabetes Other     Social History  Substance Use Topics  . Smoking status: Never Smoker   . Smokeless tobacco: None  . Alcohol Use: No    Allergies:  Allergies  Allergen Reactions  . Sulfa Antibiotics Anaphylaxis and Hives    Prescriptions prior to admission  Medication Sig Dispense Refill Last Dose  . ibuprofen (ADVIL,MOTRIN) 600 MG tablet Take 1 tablet (600 mg total) by mouth every 6 (six) hours as needed. (Patient not taking: Reported on 07/07/2015) 30 tablet 0 Not Taking at Unknown time  . ibuprofen (ADVIL,MOTRIN) 800 MG tablet Take 800 mg by mouth every 8 (eight) hours as needed for moderate pain.   07/06/2015 at 2100  . medroxyPROGESTERone (DEPO-PROVERA) 150 MG/ML injection  Inject 150 mg into the muscle every 3 (three) months. Last filled in June.   June  . metroNIDAZOLE (FLAGYL) 500 MG tablet Take 500 mg by mouth 2 (two) times daily.   07/07/2015 at 1300  . ondansetron (ZOFRAN) 4 MG tablet Take 2 tablets (8 mg total) by mouth every 8 (eight) hours as needed for nausea or vomiting. 30 tablet 0 Past Week at Unknown time  . oseltamivir (TAMIFLU) 75 MG capsule Take 1 capsule (75 mg total) by mouth 2 (two) times daily. (Patient not taking: Reported on 07/07/2015) 10 capsule 0 Not Taking at Unknown time    Review of Systems  Constitutional: Negative for fever and malaise/fatigue.  Gastrointestinal: Positive for heartburn and abdominal pain. Negative for nausea, vomiting, diarrhea and constipation.  Genitourinary: Negative for dysuria, urgency and frequency.       Neg - vaginal bleeding, discharge   Physical Exam   Blood pressure 122/81, pulse 80, temperature 98.9 F (37.2 C), temperature source Oral, resp. rate 16, SpO2 100 %.  Physical Exam  Nursing note and vitals reviewed. Constitutional: She is oriented to person, place, and time. She appears well-developed and well-nourished. No distress.  HENT:  Head: Normocephalic and atraumatic.  Cardiovascular: Normal rate.   Respiratory: Effort normal.  GI: Soft. She exhibits no distension and no mass. There is tenderness (  mild tenderness to palpation of the LUQ). There is no rebound and no guarding.  Neurological: She is alert and oriented to person, place, and time.  Skin: Skin is warm and dry. No erythema.  Psychiatric: She has a normal mood and affect.   Results for orders placed or performed during the hospital encounter of 11/04/15 (from the past 24 hour(s))  Urinalysis, Routine w reflex microscopic (not at [facility])     Status: Abnormal   Collection Time: 11/04/15  6:36 PM  Result Value Ref Range   Color, Urine YELLOW YELLOW   APPearance CLEAR CLEAR   Specific Gravity, Urine 1.010 1.005 - 1.030   pH 7.5 5.0 -  8.0   Glucose, UA NEGATIVE NEGATIVE mg/dL   Hgb urine dipstick NEGATIVE NEGATIVE   Bilirubin Urine NEGATIVE NEGATIVE   Ketones, ur NEGATIVE NEGATIVE mg/dL   Protein, ur NEGATIVE NEGATIVE mg/dL   Nitrite NEGATIVE NEGATIVE   Leukocytes, UA SMALL (A) NEGATIVE  Urine microscopic-add on     Status: Abnormal   Collection Time: 11/04/15  6:36 PM  Result Value Ref Range   Squamous Epithelial / LPF 0-5 (A) NONE SEEN   WBC, UA 0-5 0 - 5 WBC/hpf   RBC / HPF 0-5 0 - 5 RBC/hpf   Bacteria, UA NONE SEEN NONE SEEN  Pregnancy, urine POC     Status: None   Collection Time: 11/04/15  6:39 PM  Result Value Ref Range   Preg Test, Ur NEGATIVE NEGATIVE  CBC with Differential/Platelet     Status: None   Collection Time: 11/04/15  7:16 PM  Result Value Ref Range   WBC 5.3 4.0 - 10.5 K/uL   RBC 4.27 3.87 - 5.11 MIL/uL   Hemoglobin 13.2 12.0 - 15.0 g/dL   HCT 08.6 57.8 - 46.9 %   MCV 91.3 78.0 - 100.0 fL   MCH 30.9 26.0 - 34.0 pg   MCHC 33.8 30.0 - 36.0 g/dL   RDW 62.9 52.8 - 41.3 %   Platelets 214 150 - 400 K/uL   Neutrophils Relative % 42 %   Neutro Abs 2.2 1.7 - 7.7 K/uL   Lymphocytes Relative 50 %   Lymphs Abs 2.6 0.7 - 4.0 K/uL   Monocytes Relative 7 %   Monocytes Absolute 0.4 0.1 - 1.0 K/uL   Eosinophils Relative 1 %   Eosinophils Absolute 0.1 0.0 - 0.7 K/uL   Basophils Relative 0 %   Basophils Absolute 0.0 0.0 - 0.1 K/uL    MAU Course  Procedures None  MDM UPT - negative UA, CBC, CMP today Offered GI cocktail - patient refused Offered Pepcid - patient refused Patient states she has to leave to pick up her child CMP pending at time of discharge Discussed patient with Dr. Langston Masker. Agrees with plan to discharge patient at this time with Rx for Pepcid. Patient may follow-up with PCP if symptoms persist or worsen.  Assessment and Plan  A: Gastritis Acid Reflux   P: Discharge home Rx for Pepcid given to patient Warning signs for worsening condition discussed Patient advised to  follow-up with [facility] as needed, advised that PCP follow-up may be warranted given non-GYN nature of diangosis Patient may return to MAU as needed or if her condition were to change or worsen  Marny Lowenstein, PA-C  11/04/2015, 7:43 PM

## 2015-11-04 NOTE — MAU Note (Signed)
Pt presents to MAU with complaints of pain in her left side. Denies any vaginal bleeding or abnormal discharge. PT states she was treated in September for PID with rocephin

## 2015-11-04 NOTE — Discharge Instructions (Signed)
Food Choices for Gastroesophageal Reflux Disease, Adult  When you have gastroesophageal reflux disease (GERD), the foods you eat and your eating habits are very important. Choosing the right foods can help ease your discomfort.   WHAT GUIDELINES DO I NEED TO FOLLOW?   · Choose fruits, vegetables, whole grains, and low-fat dairy products.    · Choose low-fat meat, fish, and poultry.  · Limit fats such as oils, salad dressings, butter, nuts, and avocado.    · Keep a food diary. This helps you identify foods that cause symptoms.    · Avoid foods that cause symptoms. These may be different for everyone.    · Eat small meals often instead of 3 large meals a day.    · Eat your meals slowly, in a place where you are relaxed.    · Limit fried foods.    · Cook foods using methods other than frying.    · Avoid drinking alcohol.    · Avoid drinking large amounts of liquids with your meals.    · Avoid bending over or lying down until 2-3 hours after eating.    WHAT FOODS ARE NOT RECOMMENDED?   These are some foods and drinks that may make your symptoms worse:  Vegetables  Tomatoes. Tomato juice. Tomato and spaghetti sauce. Chili peppers. Onion and garlic. Horseradish.  Fruits  Oranges, grapefruit, and lemon (fruit and juice).  Meats  High-fat meats, fish, and poultry. This includes hot dogs, ribs, ham, sausage, salami, and bacon.  Dairy  Whole milk and chocolate milk. Sour cream. Cream. Butter. Ice cream. Cream cheese.   Drinks  Coffee and tea. Bubbly (carbonated) drinks or energy drinks.  Condiments  Hot sauce. Barbecue sauce.   Sweets/Desserts  Chocolate and cocoa. Donuts. Peppermint and spearmint.  Fats and Oils  High-fat foods. This includes French fries and potato chips.  Other  Vinegar. Strong spices. This includes black pepper, white pepper, red pepper, cayenne, curry powder, cloves, ginger, and chili powder.  The items listed above may not be a complete list of foods and drinks to avoid. Contact your dietitian for more  information.     This information is not intended to replace advice given to you by your health care provider. Make sure you discuss any questions you have with your health care provider.     Document Released: 04/09/2012 Document Revised: 10/30/2014 Document Reviewed: 08/13/2013  Elsevier Interactive Patient Education ©2016 [employer]vier Inc[employer]

## 2015-11-18 ENCOUNTER — Ambulatory Visit: Payer: Self-pay | Admitting: Women's Health

## 2015-11-25 ENCOUNTER — Encounter (INDEPENDENT_AMBULATORY_CARE_PROVIDER_SITE_OTHER): Payer: Self-pay

## 2015-11-25 ENCOUNTER — Encounter: Payer: Self-pay | Admitting: Neurology

## 2015-11-25 ENCOUNTER — Ambulatory Visit (INDEPENDENT_AMBULATORY_CARE_PROVIDER_SITE_OTHER): Payer: 59 | Admitting: Neurology

## 2015-11-25 VITALS — BP 114/62 | HR 70 | Resp 14 | Ht 62.0 in | Wt 123.0 lb

## 2015-11-25 DIAGNOSIS — R200 Anesthesia of skin: Secondary | ICD-10-CM

## 2015-11-25 DIAGNOSIS — R202 Paresthesia of skin: Secondary | ICD-10-CM | POA: Diagnosis not present

## 2015-11-25 DIAGNOSIS — M79604 Pain in right leg: Secondary | ICD-10-CM

## 2015-11-25 NOTE — Patient Instructions (Signed)
We will check blood work today and call you with the test results. We will do a brain scan and neck scan, called MRI and call you with the test results. We will have to schedule you for this on a separate date. This test requires authorization from your insurance, and we will take care of the insurance process. I am not sure how to explain your symptoms as yet, we will await test results.  If you have sudden worsening of symptoms, you will have to go to the ER or call 911.

## 2015-11-25 NOTE — Progress Notes (Signed)
Subjective:    Patient ID: Allison Fritz is a 25 y.o. female.  HPI     Huston Foley, MD, PhD [facility] [address], [address] [address] [place], [place] [address]  Dear Azucena Cecil,   I saw your patient, Allison Fritz, upon your kind request in my neurologic clinic today for initial consultation of her right leg pain and numbness. The patient is unaccompanied today. As you know, Allison Fritz is a 25 year old right-handed woman with an underlying medical history of low vitamin D, PID last year, who reports a several day history of right leg numbness and pain as well as difficulty walking. I reviewed your office note from 11/24/2015, which you kindly included. She has blood work at the time as well as a venous Doppler to rule out DVT was ordered at the time. Results are pending for that. Her vitamin D level was found to be low at 6.5, CMP was unremarkable, B12 was on the lower end of the spectrum at 265, folate normal, TSH normal at 1.84. She was provided with a prescription for vitamin D. Her symptoms started with right heel pain which started about 4 days ago. She woke up with this. She had a HA all day the next day, took ibuprofen and HA improved, HA was global, throbbing, no N/V, some photophobia.  No visual Sx, has contact lenses, no Lhermitte's. No history of headaches, no FHx of MS or Lupus, no OCP, had Depo shot, had a Hx of PID in 2016.  US doppler venous both LEs negative yesterday.  She has not started her vitamin D yet. She has had no further headaches. She feels that her symptoms are progressive. She reports pain and pins and needles sensation in the right distal leg and pulling sensation in the right calf. She has not fallen. She has not had any injuries. She feels a little tingling sensation in her right hand. No weakness. She works in Herbalist at [employer].  Her Past Medical History Is Significant For: Past Medical History  Diagnosis Date  . Medical  history non-contributory     Her Past Surgical History Is Significant For: Past Surgical History  Procedure Laterality Date  . No past surgeries      Her Family History Is Significant For: Family History  Problem Relation Age of Onset  . Hypertension Other   . Diabetes Other   . Diabetes Father   . Hypertension Father   . Diabetes Maternal Grandmother   . Diabetes Paternal Grandmother     Her Social History Is Significant For: Social History   Social History  . Marital Status: Single    Spouse Name: N/A  . Number of Children: 1  . Years of Education: [employer]   Social History Main Topics  . Smoking status: Never Smoker   . Smokeless tobacco: None  . Alcohol Use: 0.0 oz/week    0 Standard drinks or equivalent per week  . Drug Use: No  . Sexual Activity: Not Asked   Other Topics Concern  . None   Social History Narrative   Drinks 1 cup of coffee 2-3 times a week. Has not had soda in 3 weeks 11/25/15    Her Allergies Are:  Allergies  Allergen Reactions  . Sulfa Antibiotics Anaphylaxis and Hives  :   Her Current Medications Are:  Outpatient Encounter Prescriptions as of 11/25/2015  Medication Sig  . predniSONE (DELTASONE) 20 MG tablet Take 20 mg by mouth daily with breakfast.  .  traMADol (ULTRAM) 50 MG tablet Take by mouth every 6 (six) hours as needed.  . Vitamin D, Ergocalciferol, (DRISDOL) 50000 units CAPS capsule Take 50,000 Units by mouth every 7 (seven) days.  . [DISCONTINUED] famotidine (PEPCID) 20 MG tablet Take 1 tablet (20 mg total) by mouth 2 (two) times daily.  . [DISCONTINUED] ibuprofen (ADVIL,MOTRIN) 800 MG tablet Take 800 mg by mouth every 8 (eight) hours as needed for moderate pain.  . [DISCONTINUED] medroxyPROGESTERone (DEPO-PROVERA) 150 MG/ML injection Inject 150 mg into the muscle every 3 (three) months. Last filled in June.  . [DISCONTINUED] metroNIDAZOLE (FLAGYL) 500 MG tablet Take 500 mg by mouth 2 (two) times daily.  . [DISCONTINUED]  ondansetron (ZOFRAN) 4 MG tablet Take 2 tablets (8 mg total) by mouth every 8 (eight) hours as needed for nausea or vomiting.  . [DISCONTINUED] oseltamivir (TAMIFLU) 75 MG capsule Take 1 capsule (75 mg total) by mouth 2 (two) times daily. (Patient not taking: Reported on 07/07/2015)   No facility-administered encounter medications on file as of 11/25/2015.  :   Review of Systems:  Out of a complete 14 point review of systems, all are reviewed and negative with the exception of these symptoms as listed below:  Review of Systems  Musculoskeletal:       Joint pain, cramps, aching muscles   Neurological:       Patient reports R heel pain starting on Sunday. Monday she had a headache all day and her R leg felt like it "was asleep", Tuesday she woke up with her R leg still feeling numb. Leg is currently numb, and this morning she woke up with numbness in R hand. Fatigue. R leg is painful at night.     Objective:  Neurologic Exam  Physical Exam Physical Examination:   There were no vitals filed for this visit.  General Examination: The patient is a very pleasant 25 y.o. female in no acute distress. She appears well-developed and well-nourished and well groomed. She is tearful. She feels that she is not getting an answer fast enough. She states that her mother worries she has fibromyalgia.  HEENT: Normocephalic, atraumatic, pupils are equal, round and reactive to light and accommodation. Funduscopic exam is normal with sharp disc margins noted. Extraocular tracking is good without limitation to gaze excursion or nystagmus noted. Normal smooth pursuit is noted. Hearing is grossly intact. Face is symmetric with normal facial animation and normal facial sensation. Speech is clear with no dysarthria noted. There is no hypophonia. There is no lip, neck/head, jaw or voice tremor. Neck is supple with full range of passive and active motion. There are no carotid bruits on auscultation. Oropharynx exam  reveals: moderate mouth dryness, good dental hygiene and mild airway crowding, due to narrow airway entry. Mallampati is class II. Tongue protrudes centrally and palate elevates symmetrically.  Chest: Clear to auscultation without wheezing, rhonchi or crackles noted.  Heart: S1+S2+0, regular and normal without murmurs, rubs or gallops noted.   Abdomen: Soft, non-tender and non-distended with normal bowel sounds appreciated on auscultation.  Extremities: There is no pitting edema in the distal lower extremities bilaterally. Pedal pulses are intact.  Skin: Warm and dry without trophic changes noted. There are no varicose veins.  Musculoskeletal: exam reveals no obvious joint deformities, tenderness or joint swelling or erythema.   Neurologically:  Mental status: The patient is awake, alert and oriented in all 4 spheres. Her immediate and remote memory, attention, language skills and fund of knowledge are appropriate. There is  no evidence of aphasia, agnosia, apraxia or anomia. Speech is clear with normal prosody and enunciation. Thought process is linear. Mood is constricted and affect is blunted.  Cranial nerves II - XII are as described above under HEENT exam. In addition: shoulder shrug is normal with equal shoulder height noted. Motor exam: Normal bulk, strength and tone is noted. She does report pain with right hip flexor and knee extensor but is able to exert well and strength is at least near normal. There is no drift, tremor or rebound. Romberg is negative. Reflexes are 2 to 3+ throughout. Babinski: Toes are flexor bilaterally. Fine motor skills and coordination: intact with normal finger taps, normal hand movements, normal rapid alternating patting, normal foot taps and normal foot agility.  Cerebellar testing: No dysmetria or intention tremor on finger to nose testing. Heel to shin is unremarkable on the left but slow in difficult on the right, she reports pulling and pain in the right leg.  There is no truncal or gait ataxia.  Sensory exam: intact to light touch, pinprick, vibration, temperature sense in the upper and lower extremities with the exception of decreased temperature sense in her right leg up to her knee.  She is somewhat sensitive to pinprick sensation on her right leg. Vibration sense and light touch are equal bilaterally. She has no deficit in the upper extremities.  Gait, station and balance: She stands slowly and with effort. Posture is age-appropriate and stance is narrow based. Tandem walk is unremarkable. Assessment and Plan:    In summary, Allison Fritz is a very pleasant 25 y.o.-year old female with an underlying medical history of low vitamin D, PID last year, who reports a several day history of right leg numbness and pain as well as difficulty walking.  On examination, she has pain in her right leg, particularly reports pulling sensation in her right calf. She has symmetrical reflexes which are brisker. She has downgoing toes. She has decreased temperature sensation in her right distal leg up to the knee. Her constellation of symptoms and sudden onset with progression does open a wider differential diagnosis. This includes vascular event versus autoimmune disease versus demyelinating disease. She has no history of trauma. She has typically no headaches reported a headache 4 days ago. A migraine variant may be in the differential as well. I suggested that she start the vitamin D prescription that was called in by her primary care physician. She is furthermore advised to undergo a brain MRI with and without contrast and neck MRI with and without contrast. We will call her with her test results. She is worried about the cost of these tests. She appears to be frustrated that she has not gotten an answer thus far. She is tearful. She would like to defer blood work which I also ordered to include ANA, CRP, to tomorrow as she can have blood work done at work. I told her that  we also have phlebotomy on side with [facility], but she did not wish to stay for blood work. She also indicates that she will leave town next week. She is advised, that we can try to schedule her for her scans before she leaves.  In the interim, should she have sudden worsening of her symptoms she is strongly advised to proceed to the emergency room or call 911. I answered all her questions today. I will see her back after her tests are completed and we will keep her posted over the phone with test  regards to her test results.  Thank you very much for allowing me to participate in the care of this nice patient. If I can be of any further assistance to you please do not hesitate to call me at [phone].  Sincerely,   Huston Foley, MD, PhD

## 2015-11-26 ENCOUNTER — Telehealth: Payer: Self-pay | Admitting: Neurology

## 2015-11-26 NOTE — Telephone Encounter (Signed)
Please call patient and let her know, that adding of blood work will have to be done by the original provider ordering previous blood work and as she may know, sedimentation rate, aka ESR cannot be added and needs to be drawn fresh. I spoke at length with her referring NP, Burton Shelton at [facility] this morning. I explained my findings to him as well as additional testing requested including blood work. He is agreeable to add testing and have blood drawn at his facility. He reported he would get in touch with the patient as well to get this coordinated. As far as the MRI scans, she can have these done at anyplace of her choice. There is a good chance we can get these done soon as soon as we have insurance authorizations for these. I would be happy so see her after these results are available.  She can take the tramadol she has been given by Mr. Shelton for symptomatic pain control. I would also recommend, she pick up the Rx for the vitamin D that Mr. Shelton prescribed, as her vitamin D level was low.  

## 2015-11-26 NOTE — Telephone Encounter (Signed)
I called the patient to explain Dr. Teofilo Pod note. The patient stated I could disregard all of her recommendations because she will not be coming back to our office. She stated that she spoke to Dr. Mathews Robinsons office already and has an appointment at another neurology office Monday.

## 2015-11-26 NOTE — Telephone Encounter (Addendum)
Pt was seen in our office yesterday, 2/2. She has called back this morning to demand that our office call [facility] off of [facility]. To have any lab work being requested added to her previous lab draws done. She was very adamant that we have this done and that she did not want to have blood drawn again. She stated that she would not have any MRI testing until this was done. She also stated that all testing, lab draws and MRI testing would need to be done before Wednesday of next week. She complained that the physician did nothing for her. She stated she did not appreciate that she was asked 50 different questions. That she is in pain. I tried to explain to the pt that as a new pt she is going to be asked several questions to gain a base line of her health. She says the physician could have done more for her. Again it was told to the pt that the physician can not treat her without testing her. I told the pt that I would speak with the nurse and tell her 'her' concerns and requests. Pt stated she wants a call back today then hung up on me.

## 2016-03-31 ENCOUNTER — Ambulatory Visit: Payer: 59 | Admitting: Women's Health

## 2016-03-31 DIAGNOSIS — Z0289 Encounter for other administrative examinations: Secondary | ICD-10-CM

## 2016-05-03 ENCOUNTER — Encounter (HOSPITAL_COMMUNITY): Payer: Self-pay | Admitting: *Deleted

## 2016-05-03 ENCOUNTER — Ambulatory Visit (HOSPITAL_COMMUNITY)
Admission: EM | Admit: 2016-05-03 | Discharge: 2016-05-03 | Disposition: A | Discharge status: Home / Self Care | Payer: 59 | Attending: Family Medicine | Admitting: Family Medicine

## 2016-05-03 DIAGNOSIS — R05 Cough: Secondary | ICD-10-CM | POA: Diagnosis not present

## 2016-05-03 DIAGNOSIS — R059 Cough, unspecified: Secondary | ICD-10-CM

## 2016-05-03 MED ORDER — HYDROCOD POLST-CPM POLST ER 10-8 MG/5ML PO SUER: 5.0000 mL | Freq: Two times a day (BID) | ORAL | Status: DC | PRN

## 2016-05-03 NOTE — Discharge Instructions (Signed)

## 2016-05-03 NOTE — ED Notes (Signed)
Pt  Reports   Cough      X   3   Weeks   Dry  In nature  Symptoms  Not     releived  By  [facility]  meds

## 2016-05-03 NOTE — ED Provider Notes (Signed)
CSN: [id number]     Arrival date & time 05/03/16  1945 History   First MD Initiated Contact with Patient 05/03/16 1959     Chief Complaint  Patient presents with  . Cough   (Consider location/radiation/quality/duration/timing/severity/associated sxs/prior Treatment) HPI History obtained from patient:  Pt presents with the cc of:  Cough  Duration of symptoms: 2 weeks Treatment prior to arrival: Over-the-counter medication without resolution of symptoms Context: Patient has had a dry nonproductive cough for approximately 3 weeks. She states that she had similar symptoms last year. He was treated with Tussionex and did absolutely fine. She states that last year she was tested for whooping cough and that was negative so she does not require antibiotics. 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 Other symptoms include: None Pain score: None  FAMILY HISTORY: Hypertension    Past Medical History  Diagnosis Date  . Medical history non-contributory    Past Surgical History  Procedure Laterality Date  . No past surgeries     Family History  Problem Relation Age of Onset  . Hypertension Other   . Diabetes Other   . Diabetes Father   . Hypertension Father   . Diabetes Maternal Grandmother   . Diabetes Paternal Grandmother    Social History  Substance Use Topics  . Smoking status: Never Smoker   . Smokeless tobacco: None  . Alcohol Use: No   OB History    Gravida Para Term Preterm AB TAB SAB Ectopic Multiple Living   1 1 1  0 0 0 0 0 0 1     Review of Systems  Denies: HEADACHE, NAUSEA, ABDOMINAL PAIN, CHEST PAIN, CONGESTION, DYSURIA, SHORTNESS OF BREATH  Allergies  Sulfa antibiotics  Home Medications   Prior to Admission medications   Medication Sig Start Date End Date Taking? Authorizing Provider  chlorpheniramine-HYDROcodone (TUSSIONEX PENNKINETIC ER) 10-8 MG/5ML SUER Take 5 mLs by mouth every 12 (twelve) hours as needed for cough. 05/03/16   Tharon AquasFrank C  Terilynn Buresh, PA  predniSONE (DELTASONE) 20 MG tablet Take 20 mg by mouth daily with breakfast.    Historical Provider, MD  traMADol (ULTRAM) 50 MG tablet Take by mouth every 6 (six) hours as needed.    Historical Provider, MD  Vitamin D, Ergocalciferol, (DRISDOL) 50000 units CAPS capsule Take 50,000 Units by mouth every 7 (seven) days.    Historical Provider, MD   Meds Ordered and Administered this Visit  Medications - No data to display  BP 127/88 mmHg  Pulse 78  Temp(Src) 97.5 F (36.4 C)  Resp 18  SpO2 100%  LMP 04/22/2016 No data found.   Physical Exam NURSES NOTES AND VITAL SIGNS REVIEWED. CONSTITUTIONAL: Well developed, well nourished, no acute distress HEENT: normocephalic, atraumatic EYES: Conjunctiva normal NECK:normal ROM, supple, no adenopathy PULMONARY:No respiratory distress, normal effort ABDOMINAL: Soft, ND, NT BS+, No CVAT MUSCULOSKELETAL: Normal ROM of all extremities,  SKIN: warm and dry without rash PSYCHIATRIC: Mood and affect, behavior are normal  ED Course  Procedures (including critical care time)  Labs Review Labs Reviewed - No data to display  Imaging Review No results found.   Visual Acuity Review  Right Eye Distance:   Left Eye Distance:   Bilateral Distance:    Right Eye Near:   Left Eye Near:    Bilateral Near:      Prescription for Tussionex as provided to the patient.   MDM   1. Cough     Patient is reassured  that there are no issues that require transfer to higher level of care at this time or additional tests. Patient is advised to continue home symptomatic treatment. Patient is advised that if there are new or worsening symptoms to attend the emergency department, contact primary care provider, or return to UC. Instructions of care provided discharged home in stable condition.    THIS NOTE WAS GENERATED USING A VOICE RECOGNITION SOFTWARE PROGRAM. ALL REASONABLE EFFORTS  WERE MADE TO PROOFREAD THIS DOCUMENT FOR  ACCURACY.  I have verbally reviewed the discharge instructions with the patient. A printed AVS was given to the patient.  All questions were answered prior to discharge.      Tharon Aquas, PA 05/03/16 2052

## 2016-09-12 DIAGNOSIS — O039 Complete or unspecified spontaneous abortion without complication: Secondary | ICD-10-CM

## 2016-09-12 HISTORY — DX: Complete or unspecified spontaneous abortion without complication: O03.9

## 2017-03-23 LAB — HIV ANTIBODY (ROUTINE TESTING W REFLEX)

## 2017-03-23 LAB — HM PAP SMEAR

## 2017-09-18 DIAGNOSIS — J069 Acute upper respiratory infection, unspecified: Secondary | ICD-10-CM | POA: Diagnosis not present

## 2017-09-21 MED FILL — BROMIPHENIR-PSEUDOEPHED-DM: 30-2-10 | 6 days supply | Qty: 118 | Fill #0

## 2017-09-24 MED FILL — AZITHROMYCIN 250 MG TABLET: 250 | 5 days supply | Qty: 6 | Fill #0

## 2017-10-02 MED FILL — IBUPROFEN 800 MG TAB: 800 | 8 days supply | Qty: 24 | Fill #0

## 2017-10-02 MED FILL — AMOX TR-K CLV 875-125 MG TA: 875-125 | 10 days supply | Qty: 20 | Fill #0

## 2017-10-02 MED FILL — traMADol HCL 50 MG TABS: 50 | 2 days supply | Qty: 8 | Fill #0

## 2017-10-24 ENCOUNTER — Ambulatory Visit (INDEPENDENT_AMBULATORY_CARE_PROVIDER_SITE_OTHER): Payer: 59 | Admitting: Family Medicine

## 2017-10-24 ENCOUNTER — Encounter: Payer: Self-pay | Admitting: Family Medicine

## 2017-10-24 VITALS — BP 116/86 | HR 66 | Temp 98.6°F | Ht 62.0 in | Wt 161.0 lb

## 2017-10-24 DIAGNOSIS — R05 Cough: Secondary | ICD-10-CM

## 2017-10-24 DIAGNOSIS — R053 Chronic cough: Secondary | ICD-10-CM

## 2017-10-24 MED ORDER — AZITHROMYCIN 250 MG PO TABS: ORAL_TABLET | ORAL | 0 refills | Status: DC

## 2017-10-24 MED FILL — AZITHROMYCIN 250 MG TABLET: 250 | 5 days supply | Qty: 6 | Fill #0

## 2017-10-24 NOTE — Progress Notes (Signed)
[facility] [address] [place], [place] [address] [phone] [phone]  Date:  10/24/2017   Name:  Johney FrameKandace G Kyser   DOB:  10-May-1991   MRN:  [id number]  PCP:  No primary care provider on file.    Chief Complaint: Cough (Pt states that she's been coughing for about 3 weeks. States cough is not productive. )   History of Present Illness:  Johney FrameKandace G Mandelbaum is a 27 y.o. very pleasant female patient who presents with the following:  Here today with cough for 3 weeks or more She keeps having the cough all day and all night She is not coughing up any material No fever  She otherwise feels ok No GI symptoms No sore throat No sneezing or runny nose   Never a smoker She is generally in good heath She tried some delsym and a decongestant from the pharmacy a couple of weeks ago    She did use prednisone for bronchitis a year or so ago  LMP just finished last week  No SOB  There are no active problems to display for this patient.   Past Medical History:  Diagnosis Date  . Medical history non-contributory     Past Surgical History:  Procedure Laterality Date  . NO PAST SURGERIES      Social History   Tobacco Use  . Smoking status: Never Smoker  . Smokeless tobacco: Never Used  Substance Use Topics  . Alcohol use: No    Alcohol/week: 0.0 oz  . Drug use: No    Family History  Problem Relation Age of Onset  . Diabetes Father   . Hypertension Father   . Diabetes Maternal Grandmother   . Diabetes Paternal Grandmother   . Hypertension Other   . Diabetes Other     Allergies  Allergen Reactions  . Sulfa Antibiotics Anaphylaxis and Hives    Medication list has been reviewed and updated.  Current Outpatient Medications on File Prior to Visit  Medication Sig Dispense Refill  . chlorpheniramine-HYDROcodone (TUSSIONEX PENNKINETIC ER) 10-8 MG/5ML SUER Take 5 mLs by mouth every 12 (twelve) hours as needed for  cough. (Patient not taking: Reported on 10/24/2017) 115 mL 0  . predniSONE (DELTASONE) 20 MG tablet Take 20 mg by mouth daily with breakfast.    . traMADol (ULTRAM) 50 MG tablet Take by mouth every 6 (six) hours as needed.    . Vitamin D, Ergocalciferol, (DRISDOL) 50000 units CAPS capsule Take 50,000 Units by mouth every 7 (seven) days.     No current facility-administered medications on file prior to visit.     Review of Systems:  As per HPI- otherwise negative.    Physical Examination: Vitals:   10/24/17 1538  BP: 116/86  Pulse: 66  Temp: 98.6 F (37 C)  SpO2: 99%   Vitals:   10/24/17 1538  Weight: 161 lb (73 kg)  Height: 5\' 2"  (1.575 m)   Body mass index is 29.45 kg/m. Ideal Body Weight: Weight in (lb) to have BMI = 25: 136.4  GEN: WDWN, NAD, Non-toxic, A & O x 3, overweight, looks well HEENT: Atraumatic, Normocephalic. Neck supple. No masses, No LAD.  Bilateral TM wnl, oropharynx normal.  PEERL,EOMI.   Ears and Nose: No external deformity. CV: RRR, No M/G/R. No JVD. No thrill. No extra heart sounds. PULM: CTA B, no wheezes, crackles, rhonchi. No retractions. No resp. distress. No accessory muscle use. EXTR: No c/c/e  NEURO Normal gait.  PSYCH: Normally interactive. Conversant. Not depressed or anxious appearing.  Calm demeanor.    Assessment and Plan: Persistent cough - Plan: azithromycin (ZITHROMAX) 250 MG tablet  Persistent cough for one month rx azithromycin for her She will let me know if not helpful in the next few days- Sooner if worse.     Signed Abbe Amsterdam, MD

## 2017-10-24 NOTE — Patient Instructions (Signed)
We will treat you for bronchitis with azithromycin- let me know if not helpful !

## 2017-12-20 ENCOUNTER — Ambulatory Visit (INDEPENDENT_AMBULATORY_CARE_PROVIDER_SITE_OTHER): Payer: 59 | Admitting: Family Medicine

## 2017-12-20 ENCOUNTER — Encounter: Payer: Self-pay | Admitting: Family Medicine

## 2017-12-20 VITALS — BP 108/70 | HR 76 | Temp 98.8°F | Ht 62.0 in | Wt 160.0 lb

## 2017-12-20 DIAGNOSIS — R69 Illness, unspecified: Secondary | ICD-10-CM

## 2017-12-20 DIAGNOSIS — J111 Influenza due to unidentified influenza virus with other respiratory manifestations: Secondary | ICD-10-CM

## 2017-12-20 MED ORDER — OSELTAMIVIR PHOSPHATE 75 MG PO CAPS: 75.0000 mg | ORAL_CAPSULE | Freq: Two times a day (BID) | ORAL | 0 refills | Status: AC

## 2017-12-20 MED ORDER — HYDROCODONE-HOMATROPINE 5-1.5 MG/5ML PO SYRP: 5.0000 mL | ORAL_SOLUTION | Freq: Three times a day (TID) | ORAL | 0 refills | Status: DC | PRN

## 2017-12-20 MED FILL — HYDROCODONE-HOMATROPINE SOL: 5-1.5 | 8 days supply | Qty: 120 | Fill #0

## 2017-12-20 MED FILL — OSELTAMIVIR PHOSPHATE 75 MG: 75 | 5 days supply | Qty: 10 | Fill #0

## 2017-12-20 NOTE — Progress Notes (Signed)
Chief Complaint  Patient presents with  . Chills  . Cough    Allison FrameKandace G Fritz here for URI complaints.  Duration: 1 day  Associated symptoms: subjective fever, sinus congestion, rhinorrhea, myalgia and cough Denies: sinus pain, itchy watery eyes, ear pain, ear drainage, sore throat, wheezing and shortness of breath Treatment to date: Ibuprofen (3 hours before VS checked) Sick contacts: Yes- works in   ROS:  Const: +chils HEENT: As noted in HPI Lungs: No SOB  Past Medical History:  Diagnosis Date  . Medical history non-contributory    Family History  Problem Relation Age of Onset  . Diabetes Father   . Hypertension Father   . Diabetes Maternal Grandmother   . Diabetes Paternal Grandmother   . Hypertension Other   . Diabetes Other     BP 108/70 (BP Location: Left Arm, Patient Position: Sitting, Cuff Size: Normal)   Pulse 76   Temp 98.8 F (37.1 C) (Oral)   Ht 5\' 2"  (1.575 m)   Wt 160 lb (72.6 kg)   SpO2 99%   BMI 29.26 kg/m  General: Awake, alert, appears stated age HEENT: AT, [place], ears patent b/l and TM's neg, nares patent w/o discharge, pharynx pink and without exudates, MMM Neck: No masses or asymmetry Heart: RRR Lungs: CTAB, no accessory muscle use Psych: Age appropriate judgment and insight, normal mood and affect  Influenza-like illness - Plan: oseltamivir (TAMIFLU) 75 MG capsule, HYDROcodone-homatropine (HYCODAN) 5-1.5 MG/5ML syrup  Orders as above. Continue to push fluids, practice good hand hygiene, cover mouth when coughing. Do not drink alcohol, do any illicit/street drugs, drive or do anything that requires alertness while on the syrup. Letter for work given.  F/u prn. If starting to experience fevers, shaking, or shortness of breath, seek immediate care. Pt voiced understanding and agreement to the plan.  Jilda Rocheicholas Paul CecilWendling, DO 12/20/17 12:03 PM

## 2017-12-20 NOTE — Progress Notes (Signed)
Pre visit review using our clinic review tool, if applicable. No additional management support is needed unless otherwise documented below in the visit note. 

## 2017-12-20 NOTE — Patient Instructions (Signed)
Continue to push fluids, practice good hand hygiene, and cover your mouth if you cough.  If you start having increasing fevers, shaking or shortness of breath, seek immediate care.  Ibuprofen 400-600 mg (2-3 over the counter strength tabs) every 6 hours as needed for pain.  OK to take Tylenol 1000 mg (2 extra strength tabs) or 975 mg (3 regular strength tabs) every 6 hours as needed.  Let us know if you need anything.  

## 2017-12-27 ENCOUNTER — Encounter: Payer: Self-pay | Admitting: Family Medicine

## 2017-12-27 ENCOUNTER — Other Ambulatory Visit: Payer: Self-pay | Admitting: Family Medicine

## 2017-12-27 MED ORDER — FLUCONAZOLE 150 MG PO TABS: 150.0000 mg | ORAL_TABLET | Freq: Once | ORAL | 0 refills | Status: AC

## 2017-12-27 MED FILL — FLUCONAZOLE 150 MG TABS: 150 | 1 days supply | Qty: 1 | Fill #0

## 2018-01-14 ENCOUNTER — Ambulatory Visit (INDEPENDENT_AMBULATORY_CARE_PROVIDER_SITE_OTHER): Payer: 59 | Admitting: Family

## 2018-01-14 ENCOUNTER — Encounter: Payer: Self-pay | Admitting: Family

## 2018-01-14 VITALS — BP 126/82 | HR 70 | Temp 98.1°F | Resp 20 | Ht 63.5 in | Wt 155.6 lb

## 2018-01-14 DIAGNOSIS — Z309 Encounter for contraceptive management, unspecified: Secondary | ICD-10-CM | POA: Diagnosis not present

## 2018-01-14 DIAGNOSIS — Z87440 Personal history of urinary (tract) infections: Secondary | ICD-10-CM | POA: Diagnosis not present

## 2018-01-14 NOTE — Progress Notes (Signed)
Subjective:    Patient ID: Allison Fritz, female    DOB: 16-Feb-1991, 27 y.o.   MRN: [id number]  HPI  Patient is a 27 yr old female who presents today to establish care.   Reports that she has had some elevated blood pressure.  Father has HTN, paternal GM as well.  BP Readings from Last 3 Encounters:  01/14/18 126/82  12/20/17 108/70  10/24/17 116/86   Reports hx of infection in her fallopian tube 2015 after her IUD was removed.  She was hospitalized for 2 days at [facility].    UTI- reports that she has had UTI's in the past but none recently.   Past Medical History:  Diagnosis Date  . Blood pressure elevated without history of HTN   . Medical history non-contributory   . Miscarriage 09/12/2016     Social History   Socioeconomic History  . Marital status: Single    Spouse name: Not on file  . Number of children: 1  . Years of education: College  . Highest education level: Not on file  Occupational History  . Not on file  Social Needs  . Financial resource strain: Not on file  . Food insecurity:    Worry: Not on file    Inability: Not on file  . Transportation needs:    Medical: Not on file    Non-medical: Not on file  Tobacco Use  . Smoking status: Never Smoker  . Smokeless tobacco: Never Used  Substance and Sexual Activity  . Alcohol use: No    Alcohol/week: 0.0 oz  . Drug use: No  . Sexual activity: Not on file  Lifestyle  . Physical activity:    Days per week: Not on file    Minutes per session: Not on file  . Stress: Not on file  Relationships  . Social connections:    Talks on phone: Not on file    Gets together: Not on file    Attends religious service: Not on file    Active member of club or organization: Not on file    Attends meetings of clubs or organizations: Not on file    Relationship status: Not on file  . Intimate partner violence:    Fear of current or ex partner: Not on file    Emotionally abused: Not on file   Physically abused: Not on file    Forced sexual activity: Not on file  Other Topics Concern  . Not on file  Social History Narrative   Drinks 1 cup of coffee 2-3 times a week. 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     Past Surgical History:  Procedure Laterality Date  . NO PAST SURGERIES      Family History  Problem Relation Age of Onset  . Hypertension Father   . Diabetes Maternal Grandmother   . Diabetes Paternal Grandmother     Allergies  Allergen Reactions  . Sulfa Antibiotics Anaphylaxis and Hives    Current Outpatient Medications on File Prior to Visit  Medication Sig Dispense Refill  . Multiple Vitamin (MULTIVITAMIN) tablet Take 1 tablet by mouth daily.     No current facility-administered medications on file prior to visit.     BP 126/82   Pulse 70   Temp 98.1  F (36.7 C) (Oral)   Resp 20   Ht 5' 3.5" (1.613 m)   Wt 155 lb 9.6 oz (70.6 kg)   SpO2 100%   BMI 27.13 kg/m       Objective:   Physical Exam  Constitutional: She is oriented to person, place, and time. She appears well-developed and well-nourished.  Cardiovascular: Normal rate, regular rhythm and normal heart sounds.  No murmur heard. Pulmonary/Chest: Effort normal and breath sounds normal. No respiratory distress. She has no wheezes.  Musculoskeletal: She exhibits no edema.  Neurological: She is alert and oriented to person, place, and time.  Psychiatric: She has a normal mood and affect. Her behavior is normal. Judgment and thought content normal.          Assessment & Plan:  History of elevated blood pressure- BP looks good today.  Contraceptive management- [facility] but wishes to discuss in further detail next visit.   History of UTI- no symptoms currently. We discussed measures to help reduce UTI's  (such as urinating after intercourse, wiping front to back).   A total of 20  minutes were spent face-to-face with the patient during this encounter and over half of that time was spent on counseling and coordination of care. The patient was counseled on contraceptive management and UTI prevention.

## 2018-01-14 NOTE — Patient Instructions (Signed)
Please schedule your physical at your convenience.

## 2018-01-18 MED FILL — ACETAMINOPHEN/COD #3 TABLET: 300-30 | 2 days supply | Qty: 10 | Fill #0

## 2018-01-18 MED FILL — PROMETHAZINE 25 MG TABLET: 25 | 2 days supply | Qty: 10 | Fill #0

## 2018-01-23 ENCOUNTER — Encounter: Payer: 59 | Admitting: Obstetrics & Gynecology

## 2018-01-24 ENCOUNTER — Telehealth: Payer: Self-pay | Admitting: Family Medicine

## 2018-01-24 ENCOUNTER — Encounter: Payer: Self-pay | Admitting: Family Medicine

## 2018-01-24 MED FILL — MONO-LINYAH 28 TABLET: 0.25-35 | 84 days supply | Qty: 84 | Fill #0

## 2018-01-24 NOTE — Telephone Encounter (Signed)
Reviewed pt hx per pt request and medication list. All questions answered.

## 2018-01-28 ENCOUNTER — Ambulatory Visit (INDEPENDENT_AMBULATORY_CARE_PROVIDER_SITE_OTHER): Payer: 59 | Admitting: Family

## 2018-01-28 ENCOUNTER — Encounter: Payer: Self-pay | Admitting: Family

## 2018-01-28 VITALS — BP 117/76 | HR 80 | Temp 98.4°F | Resp 16 | Ht 63.5 in | Wt 154.8 lb

## 2018-01-28 DIAGNOSIS — Z3201 Encounter for pregnancy test, result positive: Secondary | ICD-10-CM | POA: Diagnosis not present

## 2018-01-28 DIAGNOSIS — Z309 Encounter for contraceptive management, unspecified: Secondary | ICD-10-CM

## 2018-01-28 LAB — POCT URINE HCG BY VISUAL COLOR COMPARISON TESTS: Preg Test, Ur: POSITIVE — AB

## 2018-01-28 NOTE — Progress Notes (Signed)
Subjective:    Patient ID: Allison Fritz, female    DOB: Dec 21, 1990, 27 y.o.   MRN: [id number]  HPI  [name] is a 27 yr old female who presents today to discuss abnormal menstrual bleeding.  Reports that she developed menstrual bleeding started 3/30- bled for 6 days and was heavy. Had associated heavy cramping. Had some clots. Had to convert to tampon and pad.  Had a 3 day period 2-3 weeks prior.    Review of Systems    see HPI  Past Medical History:  Diagnosis Date  . Blood pressure elevated without history of HTN   . Medical history non-contributory   . Miscarriage 09/12/2016     Social History   Socioeconomic History  . Marital status: Single    Spouse name: Not on file  . Number of children: 1  . Years of education: College  . Highest education level: Not on file  Occupational History  . Not on file  Social Needs  . Financial resource strain: Not on file  . Food insecurity:    Worry: Not on file    Inability: Not on file  . Transportation needs:    Medical: Not on file    Non-medical: Not on file  Tobacco Use  . Smoking status: Never Smoker  . Smokeless tobacco: Never Used  Substance and Sexual Activity  . Alcohol use: No    Alcohol/week: 0.0 oz  . Drug use: No  . Sexual activity: Not on file  Lifestyle  . Physical activity:    Days per week: Not on file    Minutes per session: Not on file  . Stress: Not on file  Relationships  . Social connections:    Talks on phone: Not on file    Gets together: Not on file    Attends religious service: Not on file    Active member of club or organization: Not on file    Attends meetings of clubs or organizations: Not on file    Relationship status: Not on file  . Intimate partner violence:    Fear of current or ex partner: Not on file    Emotionally abused: Not on file    Physically abused: Not on file    Forced sexual activity: Not on file  Other Topics Concern  . Not on file  Social History Narrative     Drinks 1 cup of coffee 2-3 times a week. 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     Past Surgical History:  Procedure Laterality Date  . NO PAST SURGERIES      Family History  Problem Relation Age of Onset  . Hypertension Father   . Diabetes Maternal Grandmother   . Diabetes Paternal Grandmother     Allergies  Allergen Reactions  . Sulfa Antibiotics Anaphylaxis and Hives    Current Outpatient Medications on File Prior to Visit  Medication Sig Dispense Refill  . Multiple Vitamin (MULTIVITAMIN) tablet Take 1 tablet by mouth daily.     No current facility-administered medications on file prior to visit.     BP 117/76 (BP Location: Left Arm, Cuff Size: Normal)   Pulse 80   Temp 98.4 F (36.9 C) (Oral)   Resp 16   Ht 5' 3.5" (1.613 m)  Wt 154 lb 12.8 oz (70.2 kg)   LMP 01/13/2018   SpO2 99%   BMI 26.99 kg/m    Objective:   Physical Exam  Constitutional: She is oriented to person, place, and time. She appears well-developed and well-nourished.  HENT:  Head: Normocephalic and atraumatic.  Eyes: No scleral icterus.  Musculoskeletal: She exhibits no edema.  Neurological: She is alert and oriented to person, place, and time.  Skin: Skin is warm and dry.  Psychiatric: She has a normal mood and affect. Her behavior is normal. Judgment and thought content normal.          Assessment & Plan:  Positive Urine pregnancy test- will send Quant HCG and refer to GYN for further evaluation. Advised pt that her history of bleeding/clots/cramping raises possibility of recent miscarriage. Will check serum Hcg (Quant) and arrange appointment with OB/GYN for further evaluation.   A total of 15 minutes were spent face-to-face with the patient during this encounter and over half of that time was spent on counseling  and coordination of care. The patient was counseled on + pregnancy test, necessary work up.

## 2018-01-29 ENCOUNTER — Other Ambulatory Visit: Payer: Self-pay | Admitting: Family

## 2018-01-29 ENCOUNTER — Other Ambulatory Visit: Payer: 59

## 2018-01-29 ENCOUNTER — Ambulatory Visit (HOSPITAL_BASED_OUTPATIENT_CLINIC_OR_DEPARTMENT_OTHER)
Admission: RE | Admit: 2018-01-29 | Discharge: 2018-01-29 | Disposition: A | Discharge status: Home / Self Care | Payer: 59 | Source: Ambulatory Visit | Attending: Family | Admitting: Family

## 2018-01-29 ENCOUNTER — Telehealth: Payer: Self-pay | Admitting: *Deleted

## 2018-01-29 DIAGNOSIS — Z3201 Encounter for pregnancy test, result positive: Secondary | ICD-10-CM | POA: Diagnosis not present

## 2018-01-29 DIAGNOSIS — Z3A00 Weeks of gestation of pregnancy not specified: Secondary | ICD-10-CM | POA: Diagnosis not present

## 2018-01-29 DIAGNOSIS — O209 Hemorrhage in early pregnancy, unspecified: Secondary | ICD-10-CM | POA: Diagnosis not present

## 2018-01-29 NOTE — Telephone Encounter (Signed)
Melissa -- FYI  Received call from radiology that pelvic/transvaginal ultrasound needs to be changed to US OB less than 14 weeks since pt had positive pregnancy test. Was told that order needed to be placed by Dr Abner GreenspanBlyth as it would need MD approval. Then was told radiology will change order and have NP sign off tomorrow.

## 2018-01-29 NOTE — Addendum Note (Signed)
Addended by: Harley AltoPRICE, KRISTY M on: 01/29/2018 04:36 PM   Modules accepted: Orders

## 2018-01-29 NOTE — Addendum Note (Signed)
Addended by: Harley AltoPRICE, Dametria Tuzzolino M on: 01/29/2018 04:35 PM   Modules accepted: Orders

## 2018-01-29 NOTE — Addendum Note (Signed)
Addended by: Sandford Craze'SULLIVAN, Rakesha Dalporto on: 01/29/2018 03:37 PM   Modules accepted: Orders

## 2018-01-30 ENCOUNTER — Other Ambulatory Visit (INDEPENDENT_AMBULATORY_CARE_PROVIDER_SITE_OTHER): Payer: 59

## 2018-01-30 ENCOUNTER — Encounter: Payer: Self-pay | Admitting: Obstetrics & Gynecology

## 2018-01-30 ENCOUNTER — Ambulatory Visit (INDEPENDENT_AMBULATORY_CARE_PROVIDER_SITE_OTHER): Payer: 59 | Admitting: Obstetrics & Gynecology

## 2018-01-30 VITALS — BP 124/81 | HR 85 | Ht 63.0 in | Wt 155.0 lb

## 2018-01-30 DIAGNOSIS — O039 Complete or unspecified spontaneous abortion without complication: Secondary | ICD-10-CM | POA: Diagnosis not present

## 2018-01-30 DIAGNOSIS — Z3009 Encounter for other general counseling and advice on contraception: Secondary | ICD-10-CM

## 2018-01-30 DIAGNOSIS — Z3201 Encounter for pregnancy test, result positive: Secondary | ICD-10-CM

## 2018-01-30 LAB — HCG, QUANTITATIVE, PREGNANCY: Quantitative HCG: 820.83 m[IU]/mL

## 2018-01-30 MED ORDER — NORETHIN ACE-ETH ESTRAD-FE 1-20 MG-MCG(24) PO TABS: 1.0000 | ORAL_TABLET | Freq: Every day | ORAL | 11 refills | Status: DC

## 2018-01-30 MED FILL — BLISOVI 24 FE TABLET: 1-20 | 84 days supply | Qty: 84 | Fill #0

## 2018-01-30 NOTE — Progress Notes (Signed)
History:  27 y.o. G1P1001 LMP mid March. On March 30th pt started bleeding very bad. 2 days later she was still bleeding. She stopped bleeding this past Sunday (3 days prev). She missed work and had diarrhea. She was bleeding through her tampon and having blood clots.  Pt had a UPT 2 days prev as she did not know that she was pregnancy. She is here today for eval and discUssion.  Pt is sexually active. No contraception. She is interested in in nonhormonal contraception She had PID after having an LnIUD.  Pts pain is completely resoled and the bleeding has stooped.  Pt initially had nausea which has resolved.       The following portions of the patient's history were reviewed and updated as appropriate: allergies, current medications, past family history, past medical history, past social history, past surgical history and problem list.  ALL: SULFA- rash  Review of Systems:  Pertinent items are noted in HPI.    Objective:  Physical Exam Blood pressure 124/81, pulse 85, height 5\' 3"  (1.6 m), weight 155 lb (70.3 kg), last menstrual period 01/09/2018.  CONSTITUTIONAL: Well-developed, well-nourished female in no acute distress.  HENT:  Normocephalic, atraumatic EYES: Conjunctivae and EOM are normal. No scleral icterus.  NECK: Normal range of motion SKIN: Skin is warm and dry. No rash noted. Not diaphoretic.No pallor. NEUROLGIC: Alert and oriented to person, place, and time. Normal coordination.  Pelvic: deferrred  Labs and Imaging [place] Ob Less Than 14 Weeks With Ob Transvaginal  Result Date: 01/29/2018 CLINICAL DATA:  Pregnant patient. Last menstrual period 01/04/2018. Heavy vaginal bleeding for 6 days beginning 01/19/2018. EXAM: OBSTETRIC <14 WK US AND TRANSVAGINAL OB US TECHNIQUE: Both transabdominal and transvaginal ultrasound examinations were performed for complete evaluation of the gestation as well as the maternal uterus, adnexal regions, and pelvic cul-de-sac. Transvaginal technique was  performed to assess early pregnancy. COMPARISON:  None. FINDINGS: Intrauterine gestational sac: Not visualized. The endometrium is thickened at 1.8 cm and somewhat heterogeneous. No definite blood flow within the endometrium on Doppler imaging. Yolk sac:  Not applicable. Embryo:  Not applicable. Cardiac Activity: Not applicable. Subchorionic hemorrhage:  Not applicable. Maternal uterus/adnexae: The left ovary is not visualized. Corpus luteum cyst right ovary noted. IMPRESSION: Somewhat heterogeneous and thickened endometrium at 1.8 cm worrisome for incomplete spontaneous abortion given patient history. Electronically Signed   By: Drusilla Kannerhomas  Dalessio M.D.   On: 01/29/2018 18:03    Assessment & Plan:  SAB        f/u Quant HCG done today    May need repeat HCG will determine based on results  Contraception counseling  Reviewed all forms of birth control options available including abstinence; over the counter/barrier methods; hormonal contraceptive medication including pill, patch, ring, injection,contraceptive implant; hormonal and nonhormonal IUDs; permanent sterilization options including vasectomy and the various tubal sterilization modalities. Risks and benefits reviewed.  Questions were answered.  Information was given to patient to review.  Pt wants to start OCPs  LoEstrin 1/20 1 po q day  F/u in 3 months or sooner prn  Pt may elect to use the 3 month pills after her cycles returns to normal  Total face-to-face time with patient was 20 min.  Greater than 50% was spent in counseling and coordination of care with the patient.   Autym Siess L. Harraway-Smith, M.D., Evern CoreFACOG

## 2018-01-30 NOTE — Patient Instructions (Signed)
Oral Contraception Information Oral contraceptive pills (OCPs) are medicines taken to prevent pregnancy. OCPs work by preventing the ovaries from releasing eggs. The hormones in OCPs also cause the cervical mucus to thicken, preventing the sperm from entering the uterus. The hormones also cause the uterine lining to become thin, not allowing a fertilized egg to attach to the inside of the uterus. OCPs are highly effective when taken exactly as prescribed. However, OCPs do not prevent sexually transmitted diseases (STDs). Safe sex practices, such as using condoms along with the pill, can help prevent STDs. Before taking the pill, you may have a physical exam and Pap test. Your health care provider may order blood tests. The health care provider will make sure you are a good candidate for oral contraception. Discuss with your health care provider the possible side effects of the OCP you may be prescribed. When starting an OCP, it can take 2 to 3 months for the body to adjust to the changes in hormone levels in your body. Types of oral contraception  The combination pill-This pill contains estrogen and progestin (synthetic progesterone) hormones. The combination pill comes in 21-day, 28-day, or 91-day packs. Some types of combination pills are meant to be taken continuously (365-day pills). With 21-day packs, you do not take pills for 7 days after the last pill. With 28-day packs, the pill is taken every day. The last 7 pills are without hormones. Certain types of pills have more than 21 hormone-containing pills. With 91-day packs, the first 84 pills contain both hormones, and the last 7 pills contain no hormones or contain estrogen only.  The minipill-This pill contains the progesterone hormone only. The pill is taken every day continuously. It is very important to take the pill at the same time each day. The minipill comes in packs of 28 pills. All 28 pills contain the hormone. Advantages of oral  contraceptive pills  Decreases premenstrual symptoms.  Treats menstrual period cramps.  Regulates the menstrual cycle.  Decreases a heavy menstrual flow.  May treatacne, depending on the type of pill.  Treats abnormal uterine bleeding.  Treats polycystic ovarian syndrome.  Treats endometriosis.  Can be used as emergency contraception. Things that can make oral contraceptive pills less effective OCPs can be less effective if:  You forget to take the pill at the same time every day.  You have a stomach or intestinal disease that lessens the absorption of the pill.  You take OCPs with other medicines that make OCPs less effective, such as antibiotics, certain HIV medicines, and some seizure medicines.  You take expired OCPs.  You forget to restart the pill on day 7, when using the packs of 21 pills.  Risks associated with oral contraceptive pills Oral contraceptive pills can sometimes cause side effects, such as:  Headache.  Nausea.  Breast tenderness.  Irregular bleeding or spotting.  Combination pills are also associated with a small increased risk of:  Blood clots.  Heart attack.  Stroke.  This information is not intended to replace advice given to you by your health care provider. Make sure you discuss any questions you have with your health care provider. Document Released: 12/30/2002 Document Revised: 03/16/2016 Document Reviewed: 03/30/2013 Elsevier Interactive Patient Education  2018 [employer]  

## 2018-01-30 NOTE — Progress Notes (Signed)
Pt had positive pregnancy test. Pt had bleeding starting 3/30-4/8. Pt had U/S done\

## 2018-01-30 NOTE — Telephone Encounter (Signed)
Noted  

## 2018-01-31 ENCOUNTER — Telehealth: Payer: Self-pay

## 2018-01-31 ENCOUNTER — Encounter: Payer: Self-pay | Admitting: Family

## 2018-01-31 DIAGNOSIS — O039 Complete or unspecified spontaneous abortion without complication: Secondary | ICD-10-CM

## 2018-01-31 NOTE — Telephone Encounter (Signed)
Attempted to reach patient by phone to make her aware we will need to repeat her HCG tomorrow. Phone states voicemailbox not set up. Will send my chart message as well. Armandina StammerJennifer Marquee Fuchs RN

## 2018-02-01 ENCOUNTER — Other Ambulatory Visit: Payer: 59

## 2018-02-01 ENCOUNTER — Ambulatory Visit: Payer: 59 | Admitting: Family

## 2018-02-13 ENCOUNTER — Other Ambulatory Visit (INDEPENDENT_AMBULATORY_CARE_PROVIDER_SITE_OTHER): Payer: 59

## 2018-02-13 ENCOUNTER — Encounter: Payer: Self-pay | Admitting: Family

## 2018-02-13 DIAGNOSIS — N3000 Acute cystitis without hematuria: Secondary | ICD-10-CM | POA: Diagnosis not present

## 2018-02-13 LAB — URINALYSIS, ROUTINE W REFLEX MICROSCOPIC
BILIRUBIN URINE: NEGATIVE
HGB URINE DIPSTICK: NEGATIVE
Ketones, ur: NEGATIVE
NITRITE: NEGATIVE
PH: 7 (ref 5.0–8.0)
RBC / HPF: NONE SEEN (ref 0–?)
Specific Gravity, Urine: <=1.005 — AB (ref 1.000–1.030)
Total Protein, Urine: NEGATIVE
URINE GLUCOSE: NEGATIVE
Urobilinogen, UA: 0.2 (ref 0.0–1.0)

## 2018-02-13 MED ORDER — NITROFURANTOIN MONOHYD MACRO 100 MG PO CAPS
100.0000 mg | ORAL_CAPSULE | Freq: Two times a day (BID) | ORAL | 0 refills | Status: DC
Start: 2018-02-13 — End: 2018-02-26

## 2018-02-13 MED FILL — NITROFURANTOIN MONO-MCR 100: 100 | 5 days supply | Qty: 10 | Fill #0

## 2018-02-13 NOTE — Addendum Note (Signed)
Addended by: Verdie ShireBAYNES, Eriyah Fernando M on: 02/13/2018 01:48 PM   Modules accepted: Orders

## 2018-02-14 LAB — URINE CULTURE
MICRO NUMBER:: 90501393
SPECIMEN QUALITY: ADEQUATE

## 2018-02-26 ENCOUNTER — Encounter: Payer: Self-pay | Admitting: Family

## 2018-02-26 ENCOUNTER — Ambulatory Visit (INDEPENDENT_AMBULATORY_CARE_PROVIDER_SITE_OTHER): Payer: 59 | Admitting: Family

## 2018-02-26 VITALS — BP 127/82 | HR 72 | Temp 98.4°F | Resp 16 | Ht 63.5 in | Wt 155.0 lb

## 2018-02-26 DIAGNOSIS — M25512 Pain in left shoulder: Secondary | ICD-10-CM | POA: Diagnosis not present

## 2018-02-26 MED ORDER — MELOXICAM 7.5 MG PO TABS
7.5000 mg | ORAL_TABLET | Freq: Every day | ORAL | 0 refills | Status: DC
Start: 2018-02-26 — End: 2018-11-29

## 2018-02-26 MED FILL — MELOXICAM 7.5 MG TABLET: 7.5 | 14 days supply | Qty: 14 | Fill #0

## 2018-02-26 NOTE — Patient Instructions (Signed)
Begin meloxicam once daily. You will be contacted about your referral to Dr. Pearletha Forge.

## 2018-02-26 NOTE — Progress Notes (Signed)
Subjective:    Patient ID: Allison Fritz, female    DOB: 04/22/1991, 27 y.o.   MRN: [id number]  HPI  Ms.  Brocker is a 27 yr old female who presents today with chief complaint of left sided shoulder pain. Reports that pain has been present x 6 months. Pain is has worsened over the past 2 weeks. Used a heating pad last night.  Reports that it hurts to drive, put on deodorant etc.  Denies known injury to the left shoulder or previous hx of left sided shoulder pain. She is right hand dominant.    Review of Systems See HPI  Past Medical History:  Diagnosis Date  . Blood pressure elevated without history of HTN   . Medical history non-contributory   . Miscarriage 09/12/2016     Social History   Socioeconomic History  . Marital status: Single    Spouse name: Not on file  . Number of children: 1  . Years of education: College  . Highest education level: Not on file  Occupational History  . Not on file  Social Needs  . Financial resource strain: Not on file  . Food insecurity:    Worry: Not on file    Inability: Not on file  . Transportation needs:    Medical: Not on file    Non-medical: Not on file  Tobacco Use  . Smoking status: Never Smoker  . Smokeless tobacco: Never Used  Substance and Sexual Activity  . Alcohol use: No    Alcohol/week: 0.0 oz  . Drug use: No  . Sexual activity: Yes    Birth control/protection: None  Lifestyle  . Physical activity:    Days per week: Not on file    Minutes per session: Not on file  . Stress: Not on file  Relationships  . Social connections:    Talks on phone: Not on file    Gets together: Not on file    Attends religious service: Not on file    Active member of club or organization: Not on file    Attends meetings of clubs or organizations: Not on file    Relationship status: Not on file  . Intimate partner violence:    Fear of current or ex partner: Not on file    Emotionally abused: Not on file    Physically abused: Not  on file    Forced sexual activity: Not on file  Other Topics Concern  . Not on file  Social History Narrative   Drinks 1 cup of coffee 2-3 times a week. 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     Past Surgical History:  Procedure Laterality Date  . NO PAST SURGERIES      Family History  Problem Relation Age of Onset  . Hypertension Father   . Diabetes Maternal Grandmother   . Diabetes Paternal Grandmother     Allergies  Allergen Reactions  . Sulfa Antibiotics Anaphylaxis and Hives    Current Outpatient Medications on File Prior to Visit  Medication Sig Dispense Refill  . Multiple Vitamin (MULTIVITAMIN) tablet Take 1 tablet by mouth daily.    . Norethindrone Acetate-Ethinyl Estrad-FE (LOESTRIN 24 FE) 1-20 MG-MCG(24) tablet Take 1 tablet by mouth daily. 1 Package 11   No current facility-administered medications  on file prior to visit.     BP 127/82 (BP Location: Left Arm, Patient Position: Sitting, Cuff Size: Small)   Pulse 72   Temp 98.4 F (36.9 C) (Oral)   Resp 16   Ht 5' 3.5" (1.613 m)   Wt 155 lb (70.3 kg)   SpO2 100%   BMI 27.03 kg/m       Objective:   Physical Exam  Constitutional: She is oriented to person, place, and time. She appears well-developed and well-nourished.  Musculoskeletal: She exhibits no edema.  L shoulder is without erythema or swelling.  + pain with left shoulder abduction  Neurological: She is alert and oriented to person, place, and time.  Skin: Skin is warm and dry. No erythema.  Psychiatric: She has a normal mood and affect. Her behavior is normal. Judgment and thought content normal.          Assessment & Plan:  Left shoulder pain- New. ? Rotator cuff strain. Rx with meloxicam, refer to sports medicine for further evaluation.

## 2018-03-04 ENCOUNTER — Ambulatory Visit: Payer: 59 | Admitting: Family Medicine

## 2018-03-04 ENCOUNTER — Encounter

## 2018-03-04 MED FILL — PREVIFEM 0.25-35 MG-MCG TAB: 0.25-35 | 84 days supply | Qty: 84 | Fill #1

## 2018-03-11 ENCOUNTER — Ambulatory Visit: Payer: 59 | Admitting: Family Medicine

## 2018-03-14 ENCOUNTER — Ambulatory Visit (INDEPENDENT_AMBULATORY_CARE_PROVIDER_SITE_OTHER): Payer: 59 | Admitting: Family Medicine

## 2018-03-14 ENCOUNTER — Ambulatory Visit (HOSPITAL_BASED_OUTPATIENT_CLINIC_OR_DEPARTMENT_OTHER)
Admission: RE | Admit: 2018-03-14 | Discharge: 2018-03-14 | Disposition: A | Discharge status: Home / Self Care | Payer: 59 | Source: Ambulatory Visit | Attending: Family Medicine | Admitting: Family Medicine

## 2018-03-14 ENCOUNTER — Encounter: Payer: Self-pay | Admitting: Family Medicine

## 2018-03-14 ENCOUNTER — Ambulatory Visit: Payer: Self-pay

## 2018-03-14 VITALS — BP 125/85 | HR 83 | Ht 62.0 in | Wt 155.0 lb

## 2018-03-14 DIAGNOSIS — G8929 Other chronic pain: Secondary | ICD-10-CM

## 2018-03-14 DIAGNOSIS — M25512 Pain in left shoulder: Secondary | ICD-10-CM

## 2018-03-14 MED ORDER — NITROGLYCERIN 0.2 MG/HR TD PT24: MEDICATED_PATCH | TRANSDERMAL | 1 refills | Status: DC

## 2018-03-14 NOTE — Patient Instructions (Signed)
You have a small subscapularis tear with some impingement of this muscle. Your x-rays look great. Try to avoid painful activities (overhead activities, lifting with extended arm) as much as possible. Aleve 2 tabs twice a day with food OR ibuprofen 3 tabs three times a day with food for pain and inflammation as needed. Can take tylenol in addition to this. Subacromial injection is an option but I'd like to avoid this if possible with your small partial tear. Consider physical therapy with transition to home exercise program. Do home exercise program with theraband and scapular stabilization exercises daily 3 sets of 10 once a day. Let me know if you want to do the nitro patches, physical therapy. Follow up with me in 5-6 weeks otherwise.

## 2018-03-16 DIAGNOSIS — G44209 Tension-type headache, unspecified, not intractable: Secondary | ICD-10-CM | POA: Diagnosis not present

## 2018-03-18 ENCOUNTER — Encounter: Payer: Self-pay | Admitting: Family Medicine

## 2018-03-18 DIAGNOSIS — M25512 Pain in left shoulder: Secondary | ICD-10-CM | POA: Insufficient documentation

## 2018-03-18 NOTE — Progress Notes (Signed)
PCP and consultation requested by: Sandford Craze, NP  Subjective:   HPI: Patient is a 27 y.o. female here for left shoulder pain.  Patient reports for about 6 months she has had lateral and superior left shoulder pain. No acute injury or trauma. Pain worse with driving, putting on her coat, lifting arm up to put deodorant on. Pain level 5/10 and sharp. Has tried heat, meloxicam without benefit. Difficulty sleeping as well. No skin changes, numbness. Right handed.  Past Medical History:  Diagnosis Date  . Blood pressure elevated without history of HTN   . Medical history non-contributory   . Miscarriage 09/12/2016    Current Outpatient Medications on File Prior to Visit  Medication Sig Dispense Refill  . meloxicam (MOBIC) 7.5 MG tablet Take 1 tablet (7.5 mg total) by mouth daily. 14 tablet 0  . Multiple Vitamin (MULTIVITAMIN) tablet Take 1 tablet by mouth daily.    Marland Kitchen PREVIFEM 0.25-35 MG-MCG tablet   0   No current facility-administered medications on file prior to visit.     Past Surgical History:  Procedure Laterality Date  . NO PAST SURGERIES      Allergies  Allergen Reactions  . Sulfa Antibiotics Anaphylaxis and Hives    Social History   Socioeconomic History  . Marital status: Single    Spouse name: Not on file  . Number of children: 1  . Years of education: College  . Highest education level: Not on file  Occupational History  . Not on file  Social Needs  . Financial resource strain: Not on file  . Food insecurity:    Worry: Not on file    Inability: Not on file  . Transportation needs:    Medical: Not on file    Non-medical: Not on file  Tobacco Use  . Smoking status: Never Smoker  . Smokeless tobacco: Never Used  Substance and Sexual Activity  . Alcohol use: No    Alcohol/week: 0.0 oz  . Drug use: No  . Sexual activity: Yes    Birth control/protection: None  Lifestyle  . Physical activity:    Days per week: Not on file    Minutes per  session: Not on file  . Stress: Not on file  Relationships  . Social connections:    Talks on phone: Not on file    Gets together: Not on file    Attends religious service: Not on file    Active member of club or organization: Not on file    Attends meetings of clubs or organizations: Not on file    Relationship status: Not on file  . Intimate partner violence:    Fear of current or ex partner: Not on file    Emotionally abused: Not on file    Physically abused: Not on file    Forced sexual activity: Not on file  Other Topics Concern  . Not on file  Social History Narrative   Drinks 1 cup of coffee 2-3 times a week. 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     Family History  Problem Relation Age of Onset  . Hypertension Father   . Diabetes Maternal Grandmother   . Diabetes Paternal Grandmother     BP 125/85   Pulse 83   Ht  (1.575  m)   Wt 155 lb (70.3 kg)   LMP 02/28/2018   BMI 28.35 kg/m   Review of Systems: See HPI above.     Objective:  Physical Exam:  Gen: NAD, comfortable in exam room  Left shoulder: No swelling, ecchymoses.  No gross deformity. No TTP AC joint, biceps tendon.  TTP mid-distal clavicle. FROM. Positive Hawkins, negative Neers. Negative Yergasons. Strength 5/5 with empty can and resisted internal/external rotation.  Pain with resisted internal rotation. Negative apprehension. NV intact distally.  Right shoulder: No swelling, ecchymoses.  No gross deformity. No TTP. FROM. Strength 5/5 with empty can and resisted internal/external rotation. NV intact distally.   MSK u/s left shoulder: Biceps tendon normal on long and trans views without tenosynovitis or tears.  AC joint normal without effusion or arthropathy.  Subscapularis with small partial tear but no retraction.  No  full thickness tear.  Infraspinatus and supraspinatus appear normal without tears.  No subacromial bursitis.  No impingement of supraspinatus.  Assessment & Plan:  1. Left shoulder pain - 2/2 small subscapularis tear with impingement of subscapularis.  Independently reviewed radiographs and no evidence fracture, distal clavicle osteolysis, bony lesion.  Ultrasound performed to show the tear noted.  Start home exercise program which was reviewed today.  Aleve or ibuprofen, tylenol if needed.  Consider physical therapy, nitro patches.  F/u in 5-6 weeks.

## 2018-03-18 NOTE — Assessment & Plan Note (Signed)
2/2 small subscapularis tear with impingement of subscapularis.  Independently reviewed radiographs and no evidence fracture, distal clavicle osteolysis, bony lesion.  Ultrasound performed to show the tear noted.  Start home exercise program which was reviewed today.  Aleve or ibuprofen, tylenol if needed.  Consider physical therapy, nitro patches.  F/u in 5-6 weeks.

## 2018-03-19 ENCOUNTER — Encounter: Payer: Self-pay | Admitting: Family Medicine

## 2018-03-19 ENCOUNTER — Ambulatory Visit: Payer: 59 | Admitting: Family Medicine

## 2018-03-25 ENCOUNTER — Ambulatory Visit: Payer: 59 | Admitting: Family

## 2018-04-01 ENCOUNTER — Ambulatory Visit: Payer: 59 | Admitting: Family Medicine

## 2018-04-15 ENCOUNTER — Ambulatory Visit: Payer: 59 | Admitting: Family

## 2018-04-24 ENCOUNTER — Telehealth: Payer: Self-pay | Admitting: *Deleted

## 2018-04-24 NOTE — Telephone Encounter (Signed)
Pt called requesting to pick up copy of immunization record. Record printed from Epic and given to front office for pt.

## 2018-05-10 MED FILL — PREVIFEM 0.25-35 MG-MCG TAB: 0.25-35 | 84 days supply | Qty: 84 | Fill #2

## 2018-07-09 MED FILL — PREVIFEM 0.25-35 MG-MCG TAB: 0.25-35 | 84 days supply | Qty: 84 | Fill #3

## 2018-10-05 IMAGING — DX DG SHOULDER 2+V*L*
3 series · 3 of 3 positions shown · non-contrast
Comparison: None.

CLINICAL DATA: Shoulder pain for 6 months

EXAM:
LEFT SHOULDER - 2+ VIEW

[shoulder grashey]
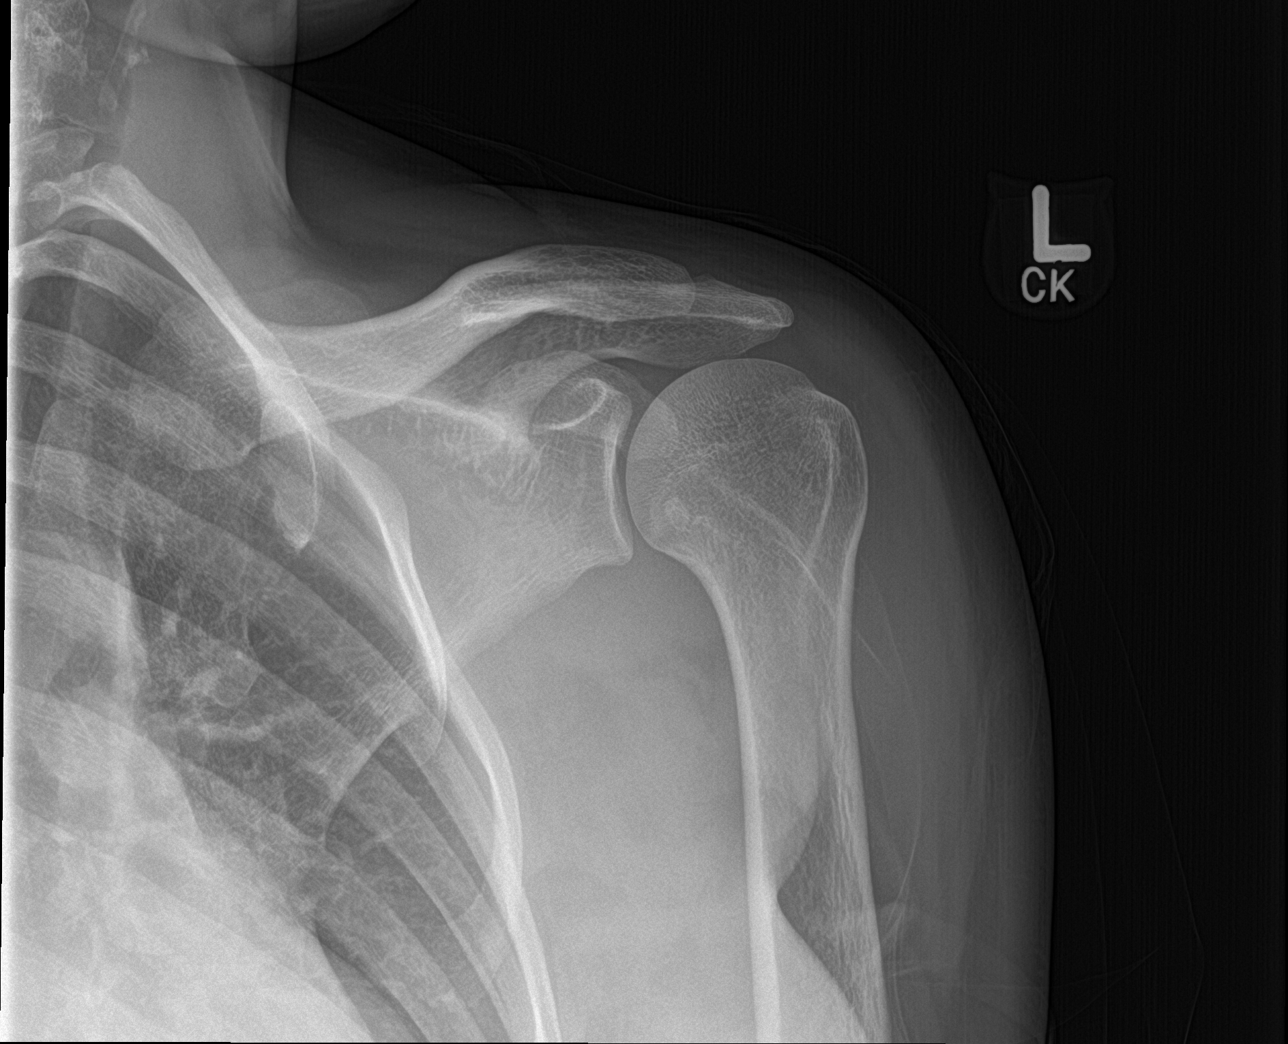

[shoulder y view]
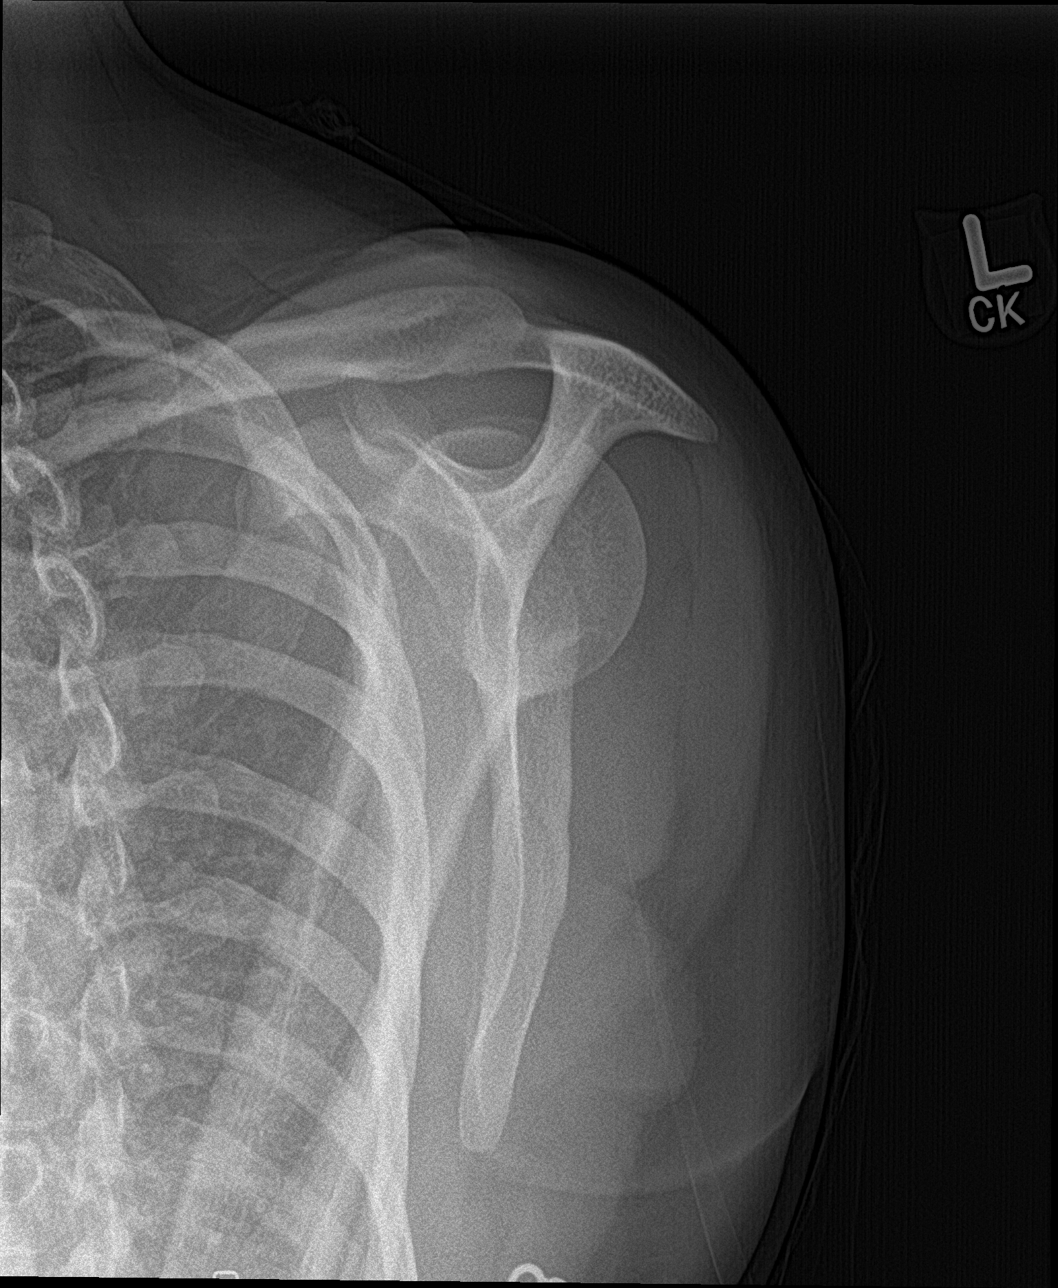

[shoulder axillary]
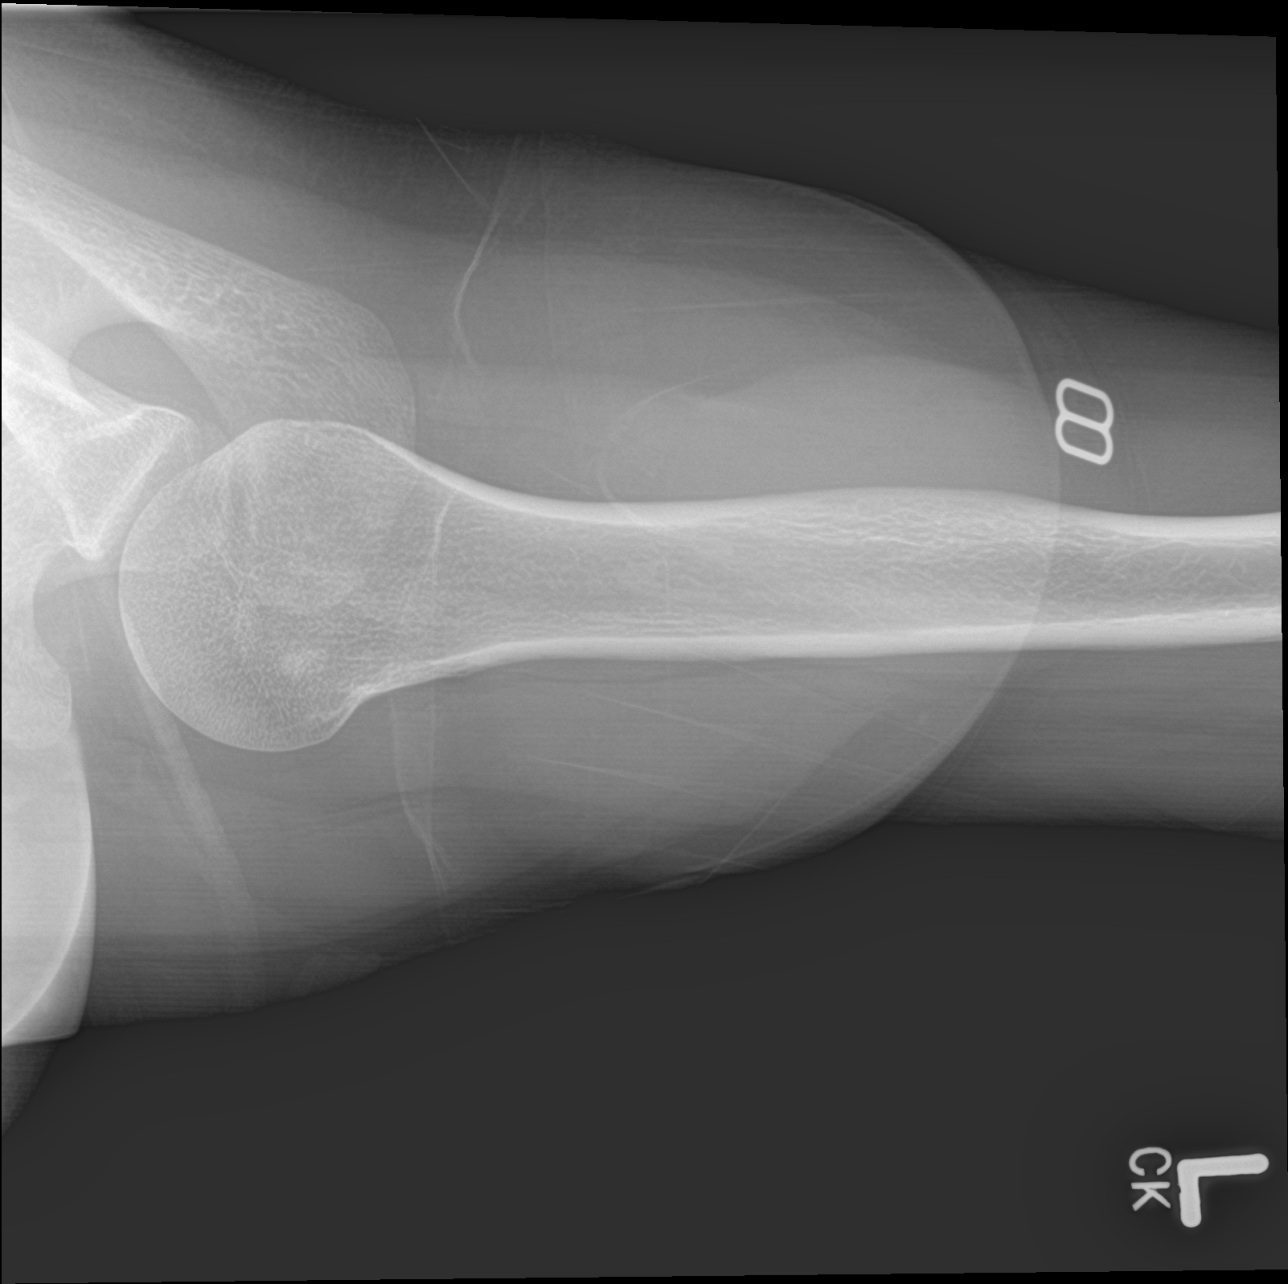

[3 of 3 positions shown; findings below may reference images not displayed]

FINDINGS: There is no evidence of fracture or dislocation. There is no
evidence of arthropathy or other focal bone abnormality. Soft
tissues are unremarkable.
IMPRESSION: Normal.

## 2018-11-28 ENCOUNTER — Inpatient Hospital Stay (HOSPITAL_COMMUNITY)
Admission: AD | Admit: 2018-11-28 | Discharge: 2018-11-29 | Disposition: A | Discharge status: Home / Self Care | Payer: No Typology Code available for payment source | Source: Ambulatory Visit | Attending: Family Medicine | Admitting: Family Medicine

## 2018-11-28 ENCOUNTER — Encounter (HOSPITAL_COMMUNITY): Payer: Self-pay | Admitting: Emergency Medicine

## 2018-11-28 DIAGNOSIS — O209 Hemorrhage in early pregnancy, unspecified: Secondary | ICD-10-CM

## 2018-11-28 DIAGNOSIS — O208 Other hemorrhage in early pregnancy: Secondary | ICD-10-CM | POA: Diagnosis not present

## 2018-11-28 DIAGNOSIS — O418X10 Other specified disorders of amniotic fluid and membranes, first trimester, not applicable or unspecified: Secondary | ICD-10-CM | POA: Diagnosis not present

## 2018-11-28 DIAGNOSIS — N939 Abnormal uterine and vaginal bleeding, unspecified: Secondary | ICD-10-CM | POA: Diagnosis present

## 2018-11-28 DIAGNOSIS — O468X1 Other antepartum hemorrhage, first trimester: Secondary | ICD-10-CM | POA: Diagnosis not present

## 2018-11-28 DIAGNOSIS — O30001 Twin pregnancy, unspecified number of placenta and unspecified number of amniotic sacs, first trimester: Secondary | ICD-10-CM | POA: Diagnosis not present

## 2018-11-28 DIAGNOSIS — O30041 Twin pregnancy, dichorionic/diamniotic, first trimester: Secondary | ICD-10-CM | POA: Diagnosis not present

## 2018-11-28 DIAGNOSIS — Z3A01 Less than 8 weeks gestation of pregnancy: Secondary | ICD-10-CM | POA: Diagnosis not present

## 2018-11-28 LAB — CBC
HCT: 35.9 % — ABNORMAL LOW (ref 36.0–46.0)
Hemoglobin: 12.3 g/dL (ref 12.0–15.0)
MCH: 30.6 pg (ref 26.0–34.0)
MCHC: 34.3 g/dL (ref 30.0–36.0)
MCV: 89.3 fL (ref 80.0–100.0)
Platelets: 212 10*3/uL (ref 150–400)
RBC: 4.02 MIL/uL (ref 3.87–5.11)
RDW: 13.4 % (ref 11.5–15.5)
WBC: 8.7 10*3/uL (ref 4.0–10.5)
nRBC: 0 % (ref 0.0–0.2)

## 2018-11-28 LAB — POCT PREGNANCY, URINE: Preg Test, Ur: POSITIVE — AB

## 2018-11-28 NOTE — MAU Note (Signed)
Vaginal bleeding that started tonight around 2200.  Had a positive hpt on 1/19.  LMP 12/25  No abdominal cramping.  Has an appointment for 3/4 w/ Dr. Erin Fulling.

## 2018-11-29 ENCOUNTER — Inpatient Hospital Stay (HOSPITAL_COMMUNITY): Payer: No Typology Code available for payment source

## 2018-11-29 ENCOUNTER — Encounter: Payer: Self-pay | Admitting: Advanced Practice Midwife

## 2018-11-29 DIAGNOSIS — O209 Hemorrhage in early pregnancy, unspecified: Secondary | ICD-10-CM | POA: Diagnosis not present

## 2018-11-29 DIAGNOSIS — O208 Other hemorrhage in early pregnancy: Secondary | ICD-10-CM | POA: Diagnosis not present

## 2018-11-29 DIAGNOSIS — O418X10 Other specified disorders of amniotic fluid and membranes, first trimester, not applicable or unspecified: Secondary | ICD-10-CM

## 2018-11-29 DIAGNOSIS — O30001 Twin pregnancy, unspecified number of placenta and unspecified number of amniotic sacs, first trimester: Secondary | ICD-10-CM

## 2018-11-29 DIAGNOSIS — O468X1 Other antepartum hemorrhage, first trimester: Secondary | ICD-10-CM

## 2018-11-29 DIAGNOSIS — Z3A01 Less than 8 weeks gestation of pregnancy: Secondary | ICD-10-CM

## 2018-11-29 LAB — URINALYSIS, ROUTINE W REFLEX MICROSCOPIC
BILIRUBIN URINE: NEGATIVE
GLUCOSE, UA: NEGATIVE mg/dL
KETONES UR: NEGATIVE mg/dL
Leukocytes, UA: NEGATIVE
Nitrite: NEGATIVE
PROTEIN: NEGATIVE mg/dL
Specific Gravity, Urine: 1.025 (ref 1.005–1.030)
pH: 6 (ref 5.0–8.0)

## 2018-11-29 LAB — WET PREP, GENITAL
Sperm: NONE SEEN
Trich, Wet Prep: NONE SEEN
Yeast Wet Prep HPF POC: NONE SEEN

## 2018-11-29 LAB — URINALYSIS, MICROSCOPIC (REFLEX)

## 2018-11-29 LAB — HCG, QUANTITATIVE, PREGNANCY: hCG, Beta Chain, Quant, S: 224740 m[IU]/mL — ABNORMAL HIGH (ref <5)

## 2018-11-29 LAB — HIV ANTIBODY (ROUTINE TESTING W REFLEX): HIV Screen 4th Generation wRfx: NONREACTIVE

## 2018-11-29 NOTE — MAU Provider Note (Signed)
Chief Complaint: Vaginal Bleeding   First Provider Initiated Contact with Patient 11/29/18 0014        SUBJECTIVE HPI: Allison Fritz is a 28 y.o. G3P1011 at Unknown by LMP who presents to maternity admissions reporting vaginal bleeding which started at 10pm.  Newly pregnant. Scheduled for new OB on 12/25/18. No paiin.. She denies vaginal itching/burning, urinary symptoms, h/a, dizziness, n/v, or fever/chills.   Vaginal Bleeding  The patient's primary symptoms include vaginal bleeding. The patient's pertinent negatives include no genital itching, genital lesions, genital odor or pelvic pain. This is a new problem. The current episode started today. The problem occurs intermittently. The problem has been gradually improving. The patient is experiencing no pain. She is pregnant. Pertinent negatives include no abdominal pain, back pain, chills, constipation, diarrhea, fever, nausea or vomiting. The vaginal discharge was bloody. The vaginal bleeding is lighter than menses. She has not been passing clots. She has not been passing tissue. Nothing aggravates the symptoms. She has tried nothing for the symptoms.   RN note: Vaginal bleeding that started tonight around 2200.  Had a positive hpt on 1/19.  LMP 12/25  No abdominal cramping.  Has an appointment for 3/4 w/ Dr. Erin Fulling  Past Medical History:  Diagnosis Date  . Blood pressure elevated without history of HTN   . Medical history non-contributory   . Miscarriage 09/12/2016   Past Surgical History:  Procedure Laterality Date  . WISDOM TOOTH EXTRACTION     Social History   Socioeconomic History  . Marital status: Single    Spouse name: Not on file  . Number of children: 1  . Years of education: College  . Highest education level: Not on file  Occupational History  . Not on file  Social Needs  . Financial resource strain: Not on file  . Food insecurity:    Worry: Not on file    Inability: Not on file  . Transportation needs:     Medical: Not on file    Non-medical: Not on file  Tobacco Use  . Smoking status: Never Smoker  . Smokeless tobacco: Never Used  Substance and Sexual Activity  . Alcohol use: No    Alcohol/week: 0.0 standard drinks  . Drug use: No  . Sexual activity: Yes    Birth control/protection: None  Lifestyle  . Physical activity:    Days per week: Not on file    Minutes per session: Not on file  . Stress: Not on file  Relationships  . Social connections:    Talks on phone: Not on file    Gets together: Not on file    Attends religious service: Not on file    Active member of club or organization: Not on file    Attends meetings of clubs or organizations: Not on file    Relationship status: Not on file  . Intimate partner violence:    Fear of current or ex partner: Not on file    Emotionally abused: Not on file    Physically abused: Not on file    Forced sexual activity: Not on file  Other Topics Concern  . Not on file  Social History Narrative   Drinks 1 cup of coffee 2-3 times a week. 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  No current facility-administered medications on file prior to encounter.    Current Outpatient Medications on File Prior to Encounter  Medication Sig Dispense Refill  . meloxicam (MOBIC) 7.5 MG tablet Take 1 tablet (7.5 mg total) by mouth daily. 14 tablet 0  . Multiple Vitamin (MULTIVITAMIN) tablet Take 1 tablet by mouth daily.    . nitroGLYCERIN (NITRODUR - DOSED IN MG/24 HR) 0.2 mg/hr patch Apply 1/4th patch to affected shoulder, change daily 30 patch 1  . PREVIFEM 0.25-35 MG-MCG tablet   0   Allergies  Allergen Reactions  . Sulfa Antibiotics Anaphylaxis and Hives    I have reviewed patient's Past Medical Hx, Surgical Hx, Family Hx, Social Hx, medications and allergies.   ROS:  Review  of Systems  Constitutional: Negative for chills and fever.  Gastrointestinal: Negative for abdominal pain, constipation, diarrhea, nausea and vomiting.  Genitourinary: Positive for vaginal bleeding. Negative for pelvic pain.  Musculoskeletal: Negative for back pain.   Review of Systems  Other systems negative   Physical Exam  Physical Exam Patient Vitals for the past 24 hrs:  BP Temp Pulse Resp SpO2 Height Weight  11/28/18 2323 123/83 98.8 F (37.1 C) 88 17 100 % 5\' 3"  (1.6 m) 68.6 kg   Constitutional: Well-developed, well-nourished female in no acute distress.  Cardiovascular: normal rate Respiratory: normal effort GI: Abd soft, non-tender. Pos BS x 4 MS: Extremities nontender, no edema, normal ROM Neurologic: Alert and oriented x 4.  GU: Neg CVAT.  PELVIC EXAM: Cervix pink, visually closed, without lesion, scant pink tinged creamy discharge, vaginal walls and external genitalia normal Bimanual exam: Cervix 0/long/high, firm, anterior, neg CMT, uterus nontender, enlarged to 6 weeks size, adnexa without tenderness, enlargement, or mass   LAB RESULTS Results for orders placed or performed during the hospital encounter of 11/28/18 (from the past 24 hour(s))  Urinalysis, Routine w reflex microscopic     Status: Abnormal   Collection Time: 11/28/18 11:32 PM  Result Value Ref Range   Color, Urine YELLOW YELLOW   APPearance CLEAR CLEAR   Specific Gravity, Urine 1.025 1.005 - 1.030   pH 6.0 5.0 - 8.0   Glucose, UA NEGATIVE NEGATIVE mg/dL   Hgb urine dipstick MODERATE (A) NEGATIVE   Bilirubin Urine NEGATIVE NEGATIVE   Ketones, ur NEGATIVE NEGATIVE mg/dL   Protein, ur NEGATIVE NEGATIVE mg/dL   Nitrite NEGATIVE NEGATIVE   Leukocytes, UA NEGATIVE NEGATIVE  Urinalysis, Microscopic (reflex)     Status: Abnormal   Collection Time: 11/28/18 11:32 PM  Result Value Ref Range   RBC / HPF 0-5 0 - 5 RBC/hpf   WBC, UA 0-5 0 - 5 WBC/hpf   Bacteria, UA RARE (A) NONE SEEN   Squamous  Epithelial / LPF 0-5 0 - 5  Pregnancy, urine POC     Status: Abnormal   Collection Time: 11/28/18 11:36 PM  Result Value Ref Range   Preg Test, Ur POSITIVE (A) NEGATIVE  CBC     Status: Abnormal   Collection Time: 11/28/18 11:43 PM  Result Value Ref Range   WBC 8.7 4.0 - 10.5 K/uL   RBC 4.02 3.87 - 5.11 MIL/uL   Hemoglobin 12.3 12.0 - 15.0 g/dL   HCT 16.135.9 (L) 09.636.0 - 04.546.0 %   MCV 89.3 80.0 - 100.0 fL   MCH 30.6 26.0 - 34.0 pg   MCHC 34.3 30.0 - 36.0 g/dL   RDW 40.913.4 81.111.5 - 91.415.5 %   Platelets 212 150 - 400 K/uL   nRBC  0.0 0.0 - 0.2 %  hCG, quantitative, pregnancy     Status: Abnormal   Collection Time: 11/28/18 11:43 PM  Result Value Ref Range   hCG, Beta Chain, Quant, S 224,740 (H) <5 mIU/mL  Wet prep, genital     Status: Abnormal   Collection Time: 11/29/18 12:22 AM  Result Value Ref Range   Yeast Wet Prep HPF POC NONE SEEN NONE SEEN   Trich, Wet Prep NONE SEEN NONE SEEN   Clue Cells Wet Prep HPF POC PRESENT (A) NONE SEEN   WBC, Wet Prep HPF POC FEW (A) NONE SEEN   Sperm NONE SEEN     IMAGING US Ob Comp Less 14 Wks  Result Date: 11/29/2018 CLINICAL DATA:  Initial evaluation for vaginal bleeding, early pregnancy. EXAM: TWIN OBSTETRIC <14WK [place] AND TRANSVAGINAL OB US COMPARISON:  None. FINDINGS: Number of IUPs:  2 Chorionicity/Amnionicity:  Dichorionic/diamniotic. TWIN 1 Yolk sac:  Present Embryo:  Present Cardiac Activity: Present Heart Rate: 123 bpm CRL: 8.9 mm mm   6 w 5 d                  [place] EDC: 07/20/2019 TWIN 2 Yolk sac:  Present Embryo:  Present Cardiac Activity: Present Heart Rate: 124 bpm CRL: 7.8 mm mm   6 w 4 d                  [place] EDC: 07/21/2019 Subchorionic hemorrhage: Small subchorionic hemorrhage without associated mass effect. Maternal uterus/adnexae: Ovaries are normal in appearance bilaterally. No adnexal mass or abnormal free fluid. Uterus otherwise unremarkable. IMPRESSION: 1. Viable twin intrauterine pregnancy with both gestational sacs demonstrating internal yolk  sacs and embryos, estimated gestational age [redacted] weeks and 4-5 days by crown-rump length. 2. Small subchorionic hemorrhage without associated mass effect. 3. No other acute maternal uterine or adnexal abnormality. Electronically Signed   By: Rise Mu M.D.   On: 11/29/2018 01:35   US Ob Comp Addl Gest Less 14 Wks  Result Date: 11/29/2018 CLINICAL DATA:  Initial evaluation for vaginal bleeding, early pregnancy. EXAM: TWIN OBSTETRIC <14WK [place] AND TRANSVAGINAL OB US COMPARISON:  None. FINDINGS: Number of IUPs:  2 Chorionicity/Amnionicity:  Dichorionic/diamniotic. TWIN 1 Yolk sac:  Present Embryo:  Present Cardiac Activity: Present Heart Rate: 123 bpm CRL: 8.9 mm mm   6 w 5 d                  [place] EDC: 07/20/2019 TWIN 2 Yolk sac:  Present Embryo:  Present Cardiac Activity: Present Heart Rate: 124 bpm CRL: 7.8 mm mm   6 w 4 d                  [place] EDC: 07/21/2019 Subchorionic hemorrhage: Small subchorionic hemorrhage without associated mass effect. Maternal uterus/adnexae: Ovaries are normal in appearance bilaterally. No adnexal mass or abnormal free fluid. Uterus otherwise unremarkable. IMPRESSION: 1. Viable twin intrauterine pregnancy with both gestational sacs demonstrating internal yolk sacs and embryos, estimated gestational age [redacted] weeks and 4-5 days by crown-rump length. 2. Small subchorionic hemorrhage without associated mass effect. 3. No other acute maternal uterine or adnexal abnormality. Electronically Signed   By: Rise Mu M.D.   On: 11/29/2018 01:35   US Ob Transvaginal  Result Date: 11/29/2018 CLINICAL DATA:  Initial evaluation for vaginal bleeding, early pregnancy. EXAM: TWIN OBSTETRIC <14WK [place] AND TRANSVAGINAL OB US COMPARISON:  None. FINDINGS: Number of IUPs:  2 Chorionicity/Amnionicity:  Dichorionic/diamniotic. TWIN 1 Yolk sac:  Present  Embryo:  Present Cardiac Activity: Present Heart Rate: 123 bpm CRL: 8.9 mm mm   6 w 5 d                  [place] EDC: 07/20/2019 TWIN 2 Yolk sac:  Present  Embryo:  Present Cardiac Activity: Present Heart Rate: 124 bpm CRL: 7.8 mm mm   6 w 4 d                  [place] EDC: 07/21/2019 Subchorionic hemorrhage: Small subchorionic hemorrhage without associated mass effect. Maternal uterus/adnexae: Ovaries are normal in appearance bilaterally. No adnexal mass or abnormal free fluid. Uterus otherwise unremarkable. IMPRESSION: 1. Viable twin intrauterine pregnancy with both gestational sacs demonstrating internal yolk sacs and embryos, estimated gestational age [redacted] weeks and 4-5 days by crown-rump length. 2. Small subchorionic hemorrhage without associated mass effect. 3. No other acute maternal uterine or adnexal abnormality. Electronically Signed   By: Rise MuBenjamin  McClintock M.D.   On: 11/29/2018 01:35     MAU Management/MDM: Ordered usual first trimester r/o ectopic labs.   Pelvic exam and cultures done Will check baseline Ultrasound to rule out ectopic.  This bleeding/pain can represent a normal pregnancy with bleeding, spontaneous abortion or even an ectopic which can be life-threatening.  The process as listed above helps to determine which of these is present.  Results reviewed with patient and partner.  Twin pregnancy with both live twins.  Small subchorionic hemorrhage.  Discussed may see light bleeding for 1-2 more weeks.   ASSESSMENT Twin intrauterine pregnancy at 3056w2d Vaginal bleeding in first trimester Small subchorionic hemorrhage  PLAN Discharge home Bleeding precautions Followup in office as scheduled  Pt stable at time of discharge. Encouraged to return here or to other Urgent Care/ED if she develops worsening of symptoms, increase in pain, fever, or other concerning symptoms.    Wynelle BourgeoisMarie Reita Shindler CNM, MSN Certified Nurse-Midwife 11/29/2018  12:14 AM

## 2018-11-29 NOTE — Discharge Instructions (Signed)
Vaginal Bleeding During Pregnancy, First Trimester  A small amount of bleeding from the vagina (spotting) is relatively common during early pregnancy. It usually stops on its own. Various things may cause bleeding or spotting during early pregnancy. Some bleeding may be related to the pregnancy, and some may not. In many cases, the bleeding is normal and is not a problem. However, bleeding can also be a sign of something serious. Be sure to tell your health care provider about any vaginal bleeding right away. Some possible causes of vaginal bleeding during the first trimester include:  Infection or inflammation of the cervix.  Growths (polyps) on the cervix.  Miscarriage or threatened miscarriage.  Pregnancy tissue developing outside of the uterus (ectopic pregnancy).  A mass of tissue developing in the uterus due to an egg being fertilized incorrectly (molar pregnancy). Follow these instructions at home: Activity  Follow instructions from your health care provider about limiting your activity. Ask what activities are safe for you.  If needed, make plans for someone to help with your regular activities.  Do not have sex or orgasms until your health care provider says that this is safe. General instructions  Take over-the-counter and prescription medicines only as told by your health care provider.  Pay attention to any changes in your symptoms.  Do not use tampons or douche.  Write down how many pads you use each day, how often you change pads, and how soaked (saturated) they are.  If you pass any tissue from your vagina, save the tissue so you can show it to your health care provider.  Keep all follow-up visits as told by your health care provider. This is important. Contact a health care provider if:  You have vaginal bleeding during any part of your pregnancy.  You have cramps or labor pains.  You have a fever. Get help right away if:  You have severe cramps in your  back or abdomen.  You pass large clots or a large amount of tissue from your vagina.  Your bleeding increases.  You feel light-headed or weak, or you faint.  You have chills.  You are leaking fluid or have a gush of fluid from your vagina. Summary  A small amount of bleeding (spotting) from the vagina is relatively common during early pregnancy.  Various things may cause bleeding or spotting in early pregnancy.  Be sure to tell your health care provider about any vaginal bleeding right away. This information is not intended to replace advice given to you by your health care provider. Make sure you discuss any questions you have with your health care provider. Document Released: 07/19/2005 Document Revised: 01/11/2017 Document Reviewed: 01/11/2017 Elsevier Interactive Patient Education  2019 [employer].  Multiple Pregnancy Having a multiple pregnancy means that a woman is carrying more than one baby at a time. She may be pregnant with twins, triplets, or more. The majority of multiple pregnancies are twins. Naturally conceiving triplets or more (higher-order multiples) is rare. Multiple pregnancies are riskier than single pregnancies. A woman with a multiple pregnancy is more likely to have certain problems during her pregnancy. Therefore, she will need to have more frequent appointments for prenatal care. How does a multiple pregnancy happen? A multiple pregnancy happens when:  The woman's body releases more than one egg at a time, and then each egg gets fertilized by a different sperm. ? This is the most common type of multiple pregnancy. ? Twins or other multiples produced this way are fraternal.  They are no more alike than non-multiple siblings are.  One sperm fertilizes one egg, which then divides into more than one embryo. ? Twins or other multiples produced this way are identical. Identical multiples are always the same gender, and they look very much alike. Who is most  likely to have a multiple pregnancy? A multiple pregnancy is more likely to develop in women who:  Have had fertility treatment, especially if the treatment included fertility drugs.  Are older than 28 years of age.  Have already had four or more children.  Have a family history of multiple pregnancy. How is a multiple pregnancy diagnosed? A multiple pregnancy may be diagnosed based on:  Symptoms such as: ? Rapid weight gain in the first 3 months of pregnancy (first trimester). ? More severe nausea and breast tenderness than what is typical of a single pregnancy. ? The uterus measuring larger than what is normal for the stage of the pregnancy.  Blood tests that detect a higher-than-normal level of human chorionic gonadotropin (hCG). This is a hormone that your body produces in early pregnancy.  Ultrasound exam. This is used to confirm that you are carrying multiples. What risks are associated with multiple pregnancy? A multiple pregnancy puts you at a higher risk for certain problems during or after your pregnancy, including:  Having your babies delivered before you have reached a full-term pregnancy (preterm birth). A full-term pregnancy lasts for at least 37 weeks. Babies born before 37 weeks may have a higher risk of a variety of health problems, such as breathing problems, feeding difficulties, cerebral palsy, and learning disabilities.  Diabetes.  Preeclampsia. This is a serious condition that causes high blood pressure along with other symptoms, such as swelling and headaches, during pregnancy.  Excessive blood loss after childbirth (postpartum hemorrhage).  Postpartum depression.  Low birth weight of the babies. How will having a multiple pregnancy affect my care? Your health care provider will want to monitor you more closely during your pregnancy to make sure that your babies are growing normally and that you are healthy. Follow these instructions at home: Because your  pregnancy is considered to be high risk, you will need to work closely with your health care team. You may also need to make some lifestyle changes. These may include the following: Eating and drinking  Increase your nutrition. ? Follow your health care providers recommendations for weight gain. You may need to gain a little extra weight when you are pregnant with multiples. ? Eat healthy snacks often throughout the day. This can add calories and reduce nausea.  Drink enough fluid to keep your urine pale yellow.  Take prenatal vitamins. Activity By 20-24 weeks, you may need to limit your activities.  Avoid activities and work that take a lot of effort (are strenuous).  Ask your health care provider when you should stop having sexual intercourse.  Rest often. General instructions  Do not use any products that contain nicotine or tobacco, such as cigarettes and e-cigarettes. If you need help quitting, ask your health care provider.  Do not drink alcohol or use illegal drugs.  Take over-the-counter and prescription medicines only as told by your health care provider.  Arrange for extra help around the house.  Keep all follow-up visits and all prenatal visits as told by your health care provider. This is important. Contact a health care provider if:  You have dizziness.  You have persistent nausea, vomiting, or diarrhea.  You are having trouble gaining weight.  You have feelings of depression or other emotions that are interfering with your normal activities. Get help right away if:  You have a fever.  You have pain with urination.  You have fluid leaking from your vagina.  You have a bad-smelling vaginal discharge.  You notice increased swelling in your face, hands, legs, or ankles.  You have spotting or bleeding from your vagina.  You have pelvic cramps, pelvic pressure, or nagging pain in your abdomen or lower back.  You are having regular contractions.  You  develop a severe headache, with or without visual changes.  You have shortness of breath or chest pain.  You notice less fetal movement, or no fetal movement. Summary  Having a multiple pregnancy means that a woman is carrying more than one baby at a time.  A multiple pregnancy puts you at a higher risk for certain problems during and after your pregnancy, such as: having your babies delivered before you have reached a full-term pregnancy (preterm birth), diabetes, preeclampsia, excessive blood loss after childbirth (postpartum hemorrhage), postpartum depression, or low birth weight of the babies.  Your health care provider will want to monitor you more closely during your pregnancy to make sure that your babies are growing normally and that you are healthy.  You may need to make some lifestyle changes during pregnancy, including: increasing your nutrition, limiting your activities after 20-24 weeks of pregnancy, and arranging for extra help around the house. This information is not intended to replace advice given to you by your health care provider. Make sure you discuss any questions you have with your health care provider. Document Released: 07/18/2008 Document Revised: 07/04/2017 Document Reviewed: 06/09/2016 Elsevier Interactive Patient Education  [employer]2019 Elsevier Inc.

## 2018-11-30 LAB — GC/CHLAMYDIA PROBE AMP (~~LOC~~) NOT AT ARMC
Chlamydia: NEGATIVE
Neisseria Gonorrhea: NEGATIVE

## 2018-12-02 ENCOUNTER — Ambulatory Visit: Payer: 59 | Admitting: Family

## 2018-12-02 DIAGNOSIS — Z0289 Encounter for other administrative examinations: Secondary | ICD-10-CM

## 2018-12-04 ENCOUNTER — Telehealth: Payer: Self-pay

## 2018-12-04 NOTE — Telephone Encounter (Signed)
Patient called stating that she was seen in ED on 11-28-2018. And then had bleeding on Sunday that was heavy and bright red. Patient made aware I can bring her in for her NEw ob on this Friday but if she is still experiencing heavy bleeding she may go to [facility] for evaluation. Patient states understanding. Armandina Stammer Rn

## 2018-12-05 ENCOUNTER — Ambulatory Visit (INDEPENDENT_AMBULATORY_CARE_PROVIDER_SITE_OTHER): Payer: No Typology Code available for payment source | Admitting: Obstetrics & Gynecology

## 2018-12-05 ENCOUNTER — Encounter: Payer: Self-pay | Admitting: Obstetrics & Gynecology

## 2018-12-05 VITALS — BP 135/79 | HR 76 | Wt 149.0 lb

## 2018-12-05 DIAGNOSIS — Z3A01 Less than 8 weeks gestation of pregnancy: Secondary | ICD-10-CM

## 2018-12-05 DIAGNOSIS — O30001 Twin pregnancy, unspecified number of placenta and unspecified number of amniotic sacs, first trimester: Secondary | ICD-10-CM

## 2018-12-05 DIAGNOSIS — O30049 Twin pregnancy, dichorionic/diamniotic, unspecified trimester: Secondary | ICD-10-CM | POA: Insufficient documentation

## 2018-12-05 DIAGNOSIS — O0990 Supervision of high risk pregnancy, unspecified, unspecified trimester: Secondary | ICD-10-CM

## 2018-12-05 DIAGNOSIS — O0991 Supervision of high risk pregnancy, unspecified, first trimester: Secondary | ICD-10-CM

## 2018-12-05 NOTE — Progress Notes (Signed)
DATING AND VIABILITY SONOGRAM   Allison Fritz is a 28 y.o. year old G68P1011 with LMP Patient's last menstrual period was 10/16/2018. which would correlate to  [redacted]w[redacted]d weeks gestation.  She has regular menstrual cycles.   She is here today for a confirmatory initial sonogram.    GESTATION: TWIN gestation     FETAL ACTIVITY:          Heart rate        Baby A: 156   Baby B:154          The fetus is active.    ADNEXA: The ovaries are normal.   GESTATIONAL AGE AND  BIOMETRICS:  Gestational criteria: Estimated Date of Delivery: 07/23/19 by LMP now at [redacted]w[redacted]d  Previous Scans:1  CROWN RUMP LENGTH A           1.03 cm         7-0  weeks  CROWN RUMP LENGTH B           1.22 m         7-2weeks                                                                               AVERAGE EGA(BY THIS SCAN):  7-1 weeks  WORKING EDD( LMP ):   07-23-2019    TECHNICIAN COMMENTS:  Patient had an episode of heavy bleeding that has since resolved. Subchronic hemorraghe noted on first ultrasound in MAU.   Patient informed that the ultrasound is considered a limited obstetric ultrasound and is not intended to be a complete ultrasound exam. Patient also informed that the ultrasound is not being completed with the intent of assessing for fetal or placental anomalies or any pelvic abnormalities. Explained that the purpose of today's ultrasound is to assess for fetal heart rate. Patient acknowledges the purpose of the exam and the limitations of the study.    Armandina Stammer 12/05/2018 9:55 AM

## 2018-12-05 NOTE — Progress Notes (Signed)
  Subjective:    Allison Fritz is being seen today for her first obstetrical visit.   She is at [redacted]w[redacted]d gestation. Her obstetrical history is significant for twins. Relationship with FOB: spouse, living together. Patient does not intend to breast feed. Pregnancy history fully reviewed.  Patient reports no complaints. She had a gush of blood yesterday but only bloody mucous today.  Review of Systems:   Review of Systems She works for [employer] in [employer].  Objective:     BP 135/79   Pulse 76   Wt 149 lb (67.6 kg)   LMP 10/16/2018   BMI 26.39 kg/m  Physical Exam  Exam Breathing, conversing, and ambulating normally Well nourished, well hydrated Black female, no apparent distress Heart- rrr Lungs- CTAB Abd- benign Speculum exam- cervix appears normal, blood tinged mucous noted FHR reassuring x 2 on u/s at bedside   Assessment:    Pregnancy: G3P1011 Patient Active Problem List   Diagnosis Date Noted  . Left shoulder pain 03/18/2018       Plan:     Initial labs drawn. Prenatal vitamins. Problem list reviewed and updated. Role of ultrasound in pregnancy discussed; fetal survey: requested. Pelvic rest rec'd at the present  Allison Fritz 12/05/2018

## 2018-12-05 NOTE — Addendum Note (Signed)
Addended by: Anell Barr on: 12/05/2018 10:16 AM   Modules accepted: Orders

## 2018-12-06 ENCOUNTER — Encounter: Payer: No Typology Code available for payment source | Admitting: Family Medicine

## 2018-12-11 ENCOUNTER — Other Ambulatory Visit: Payer: Self-pay

## 2018-12-11 DIAGNOSIS — O219 Vomiting of pregnancy, unspecified: Secondary | ICD-10-CM

## 2018-12-11 MED ORDER — DOXYLAMINE-PYRIDOXINE 10-10 MG PO TBEC: DELAYED_RELEASE_TABLET | ORAL | 3 refills | Status: DC

## 2018-12-23 ENCOUNTER — Encounter: Payer: 59 | Admitting: Obstetrics & Gynecology

## 2018-12-25 ENCOUNTER — Encounter: Payer: 59 | Admitting: Obstetrics & Gynecology

## 2019-01-14 ENCOUNTER — Other Ambulatory Visit: Payer: Self-pay

## 2019-01-14 ENCOUNTER — Encounter: Payer: Self-pay | Admitting: Advanced Practice Midwife

## 2019-01-14 ENCOUNTER — Ambulatory Visit (INDEPENDENT_AMBULATORY_CARE_PROVIDER_SITE_OTHER): Payer: No Typology Code available for payment source | Admitting: Advanced Practice Midwife

## 2019-01-14 VITALS — BP 122/75 | HR 93 | Temp 98.4°F | Wt 141.0 lb

## 2019-01-14 DIAGNOSIS — Z3A12 12 weeks gestation of pregnancy: Secondary | ICD-10-CM

## 2019-01-14 DIAGNOSIS — O30001 Twin pregnancy, unspecified number of placenta and unspecified number of amniotic sacs, first trimester: Secondary | ICD-10-CM

## 2019-01-14 DIAGNOSIS — O0990 Supervision of high risk pregnancy, unspecified, unspecified trimester: Secondary | ICD-10-CM

## 2019-01-14 LAB — POCT URINALYSIS DIPSTICK OB
Blood, UA: NEGATIVE
Glucose, UA: NEGATIVE
Ketones, UA: NEGATIVE
NITRITE UA: NEGATIVE
Spec Grav, UA: 1.015 (ref 1.010–1.025)
pH, UA: 6 (ref 5.0–8.0)

## 2019-01-14 MED ORDER — ONDANSETRON 4 MG PO TBDP: 4.0000 mg | ORAL_TABLET | Freq: Four times a day (QID) | ORAL | 0 refills | Status: DC | PRN

## 2019-01-14 NOTE — Addendum Note (Signed)
Addended by: Anell Barr on: 01/14/2019 10:53 AM   Modules accepted: Orders

## 2019-01-14 NOTE — Addendum Note (Signed)
Addended by: Lorelle Gibbs L on: 01/14/2019 11:01 AM   Modules accepted: Orders

## 2019-01-14 NOTE — Patient Instructions (Signed)

## 2019-01-14 NOTE — Progress Notes (Signed)
   PRENATAL VISIT NOTE  Subjective:  Allison Fritz is a 28 y.o. G3P1011 at [redacted]w[redacted]d being seen today for ongoing prenatal care.  She is currently monitored for the following issues for this low-risk pregnancy and has Left shoulder pain; Supervision of high risk pregnancy, antepartum; and Twin gestation in first trimester on their problem list.  Patient reports some nausea despite Diclegis (wants Zofran), intermittent rashes on body that come and go, flaky but not itching.  Contractions: Not present. Vag. Bleeding: None.   . Denies leaking of fluid.   The following portions of the patient's history were reviewed and updated as appropriate: allergies, current medications, past family history, past medical history, past social history, past surgical history and problem list.   Objective:   Vitals:   01/14/19 0808  BP: 122/75  Pulse: 93  Temp: 98.4 F (36.9 C)  Weight: 64 kg    Fetal Status: Fetal Heart Rate (bpm): 148/156         General:  Alert, oriented and cooperative. Patient is in no acute distress.  Skin: Skin is warm and dry. No rash noted.   Cardiovascular: Normal heart rate noted  Respiratory: Normal respiratory effort, no problems with respiration noted  Abdomen: Soft, gravid, appropriate for gestational age.  Pain/Pressure: Present     Pelvic: Cervical exam deferred        Extremities: Normal range of motion.  Edema: None  Mental Status: Normal mood and affect. Normal behavior. Normal judgment and thought content.   Assessment and Plan:  Pregnancy: G3P1011 at [redacted]w[redacted]d 1. Supervision of high risk pregnancy, antepartum      Conservative care, Reviewed warning signs       Declines Panorama       - Culture, OB Urine  2.   Nausea     Rx Zofran for PRN use with warnings  Preterm labor symptoms and general obstetric precautions including but not limited to vaginal bleeding, contractions, leaking of fluid and fetal movement were reviewed in detail with the patient. Please  refer to After Visit Summary for other counseling recommendations.    Future Appointments  Date Time Provider Department Center  02/17/2019  8:35 AM Reva Bores, MD [facility]    Wynelle Bourgeois, CNM

## 2019-01-16 ENCOUNTER — Telehealth: Payer: Self-pay

## 2019-01-16 DIAGNOSIS — G43809 Other migraine, not intractable, without status migrainosus: Secondary | ICD-10-CM

## 2019-01-16 LAB — URINE CULTURE, OB REFLEX

## 2019-01-16 LAB — CULTURE, OB URINE

## 2019-01-16 MED ORDER — CYCLOBENZAPRINE HCL 10 MG PO TABS: 10.0000 mg | ORAL_TABLET | Freq: Three times a day (TID) | ORAL | 0 refills | Status: DC

## 2019-01-16 NOTE — Telephone Encounter (Signed)
Pt called the office stating that she has been having a migraine headache since last. Pt states that she does not have a hx of migraines. Pt states that she can not take tylenol because it makes her sick. Per Dr. Adrian Blackwater, pt can take Flexeril 10 mg TID PRN .Medication was sent to the pharmacy. Arlina Sabina l Natilie Krabbenhoft, CMA

## 2019-01-27 ENCOUNTER — Encounter: Payer: Self-pay | Admitting: Advanced Practice Midwife

## 2019-01-28 NOTE — Telephone Encounter (Signed)
Having trouble with constipation Have been communicating via MyChart  Sent her Dr  Blas recommendation for Miralax as follows:   You have constipation which is hard stools that are difficult to pass. It is important to have regular bowel movements every 1-3 days that are soft and easy to pass. Hard stools increase your risk of hemorrhoids and are very uncomfortable.   To prevent constipation you can increase the amount of fiber in your diet. Examples of foods with fiber are leafy greens, whole grain breads, oatmeal and other grains.  It is also important to drink at least eight 8oz glass of water everyday.   If you have not has a bowel movement in 4-5 days you made need to clean out your bowel.  This will have establish normal movement through your bowel.    Miralax Clean out  Take 8 capfuls of miralax in 64 oz of gatorade. You can use any fluid that appeals to you (gatorade, water, juice)  Continue to drink at least eight 8 oz glasses of water throughout the day  You can repeat with another 8 capfuls of miralax in 64 oz of gatorade if you are not having a large amount of stools  You will need to be at home and close to a bathroom for about 8 hours when you do the above as you may need to go to the bathroom frequently.   After you are cleaned out: - Start Colace100mg  twice daily - Start Miralax once daily - Start a daily fiber supplement like metamucil or citrucel - You can safely use enemas in pregnancy  - if you are having diarrhea you can reduce to Colace once a day or miralax every other day or a 1/2 capful daily.   Patient will let us know if successful or not

## 2019-02-04 ENCOUNTER — Encounter: Payer: No Typology Code available for payment source | Admitting: Student

## 2019-02-04 ENCOUNTER — Encounter: Payer: No Typology Code available for payment source | Admitting: Advanced Practice Midwife

## 2019-02-11 ENCOUNTER — Telehealth: Payer: Self-pay | Admitting: Obstetrics and Gynecology

## 2019-02-11 DIAGNOSIS — O0990 Supervision of high risk pregnancy, unspecified, unspecified trimester: Secondary | ICD-10-CM

## 2019-02-11 NOTE — Telephone Encounter (Signed)
Pt called and stated that she had some concerns with her BP.  Pt states that her BP has been elevated in the past prior to pregnancy.  Pt reports that she is having headaches and severely constipated.  Per chart review pt was suppose to have taken Miralax.  I asked pt if she has taken Miralax pt stated that she did like every other day and it was not effective.  I explained to the pt that she should take it once a day with some apple or prune juice.  Pt stated understanding.  Connected pt with Babyscripts.  Pt was able to locate and put in BP value of 120/80.  Pt advised that she will need to check her BP weekly and and if she feels like she has symptoms of elevated BP.  I informed pt that she can come in for a BP check tomorrow.  Pt stated thank you with no further questions.

## 2019-02-12 ENCOUNTER — Telehealth: Payer: Self-pay | Admitting: *Deleted

## 2019-02-12 ENCOUNTER — Ambulatory Visit (INDEPENDENT_AMBULATORY_CARE_PROVIDER_SITE_OTHER): Payer: Medicaid Other | Admitting: *Deleted

## 2019-02-12 ENCOUNTER — Other Ambulatory Visit: Payer: Self-pay

## 2019-02-12 VITALS — BP 118/69 | HR 94 | Ht 62.0 in | Wt 143.6 lb

## 2019-02-12 DIAGNOSIS — O0990 Supervision of high risk pregnancy, unspecified, unspecified trimester: Secondary | ICD-10-CM

## 2019-02-12 MED ORDER — ONDANSETRON 4 MG PO TBDP: 4.0000 mg | ORAL_TABLET | Freq: Four times a day (QID) | ORAL | 1 refills | Status: DC | PRN

## 2019-02-12 NOTE — Progress Notes (Signed)
Pt presents for BP check.  BP at visit 118/69.  Pt reports she has occasional headaches but believes they are due to her stopping caffeine.  Pt also reports she is experiencing morning sickness and was too sleepy with the Diclegis but the zofran she was prescribed does work.  She reports that how much she takes per day varies but but usually it's 1-2 times daily.  Pt requested refill.  Reviewed with Dr. Vergie Living and month's supply plus 1 refill given and sent to pt's pharmacy.  Pt verbalized understanding that she needs to make sure she is getting enough sleep, eating enough, and consuming enough water.  Pt verbalized understanding.  Pt educated on [employer] use.  Pt has babyscripts and is able to log in her BP but does not yet have a cuff.  Pt states that the cuff is coming in the mail and she will begin logging her BP when she gets the cuff.  Pt states that the BP she logged in yesterday, 02/11/19, was a "test" BP to indicate to her the range of BP she wants to be in.

## 2019-02-12 NOTE — Telephone Encounter (Signed)
Called pt to inform her that she did not need to come in today for a BP check as she has logged normal BP in baby scripts.  Pt stated that she wanted to keep her appointment and would be present at the office shortly.

## 2019-02-17 ENCOUNTER — Ambulatory Visit (INDEPENDENT_AMBULATORY_CARE_PROVIDER_SITE_OTHER): Payer: Medicaid Other | Admitting: Family Medicine

## 2019-02-17 ENCOUNTER — Encounter: Payer: Self-pay | Admitting: Family Medicine

## 2019-02-17 VITALS — BP 120/80 | Wt 143.8 lb

## 2019-02-17 DIAGNOSIS — O0990 Supervision of high risk pregnancy, unspecified, unspecified trimester: Secondary | ICD-10-CM

## 2019-02-17 DIAGNOSIS — O219 Vomiting of pregnancy, unspecified: Secondary | ICD-10-CM

## 2019-02-17 DIAGNOSIS — O30042 Twin pregnancy, dichorionic/diamniotic, second trimester: Secondary | ICD-10-CM

## 2019-02-17 DIAGNOSIS — O30049 Twin pregnancy, dichorionic/diamniotic, unspecified trimester: Secondary | ICD-10-CM

## 2019-02-17 DIAGNOSIS — O0992 Supervision of high risk pregnancy, unspecified, second trimester: Secondary | ICD-10-CM

## 2019-02-17 DIAGNOSIS — Z3A17 17 weeks gestation of pregnancy: Secondary | ICD-10-CM

## 2019-02-17 MED ORDER — PROMETHAZINE HCL 25 MG PO TABS: 25.0000 mg | ORAL_TABLET | Freq: Four times a day (QID) | ORAL | 1 refills | Status: DC | PRN

## 2019-02-17 NOTE — Progress Notes (Signed)
I have reviewed the chart and agree with nursing staff's documentation of this patient's encounter.  Sherell Christoffel, MD 02/17/2019 9:22 AM    

## 2019-02-17 NOTE — Progress Notes (Signed)
    PRENATAL VISIT NOTE TELEHEALTH VIRTUAL OBSTETRICS VISIT ENCOUNTER NOTE  I connected with Allison Fritz  on 02/17/19 at  8:35 AM EDT by Webex at home and verified that I am speaking with the correct person using two identifiers.   I discussed the limitations, risks, security and privacy concerns of performing an evaluation and management service by telephone and the availability of in person appointments. I also discussed with the patient that there may be a patient responsible charge related to this service. The patient expressed understanding and agreed to proceed. Subjective:  Allison Fritz is a 28 y.o. G3P1011 at [redacted]w[redacted]d being seen today for ongoing prenatal care.  She is currently monitored for the following issues for this high-risk pregnancy and has Left shoulder pain; Supervision of high risk pregnancy, antepartum; and Dichorionic diamniotic twin pregnancy, antepartum on their problem list.  Patient reports nausea and constipation and leg pain when lying down at night--full length of right leg, notes no swelling.  Reports fetal movement. Contractions: Not present. Vag. Bleeding: None.  Movement: Present. Denies any contractions, bleeding or leaking of fluid.   The following portions of the patient's history were reviewed and updated as appropriate: allergies, current medications, past family history, past medical history, past social history, past surgical history and problem list.   Objective:   Vitals:   02/17/19 0842  BP: 120/80  Weight: 143 lb 12.8 oz (65.2 kg)    Fetal Status:     Movement: Present     General:  Alert, oriented and cooperative. Patient is in no acute distress.  Respiratory: Normal respiratory effort, no problems with respiration noted  Mental Status: Normal mood and affect. Normal behavior. Normal judgment and thought content.  Rest of physical exam deferred due to type of encounter  Assessment and Plan:  Pregnancy: G3P1011 at [redacted]w[redacted]d 1. Supervision  of high risk pregnancy, antepartum Declined genetic testing Has some sciatica--comfort measures reviewed  2. Dichorionic diamniotic twin pregnancy, antepartum Anatomy u/s scheduled today Discussed high risk nature of twin pregnancy - [place] MFM OB DETAIL +14 WK; Future - [place] MFM OB DETAIL ADDL GEST +14 WK; Future  3. Nausea and vomiting in pregnancy prior to [redacted] weeks gestation zofran is constipating--trial of phenergan - promethazine (PHENERGAN) 25 MG tablet; Take 1 tablet (25 mg total) by mouth every 6 (six) hours as needed for nausea or vomiting.  Dispense: 30 tablet; Refill: 1  Working from home with [employer]  General obstetric precautions including but not limited to vaginal bleeding, contractions, leaking of fluid and fetal movement were reviewed in detail with the patient. I discussed the assessment and treatment plan with the patient. The patient was provided an opportunity to ask questions and all were answered. The patient agreed with the plan and demonstrated an understanding of the instructions. The patient was advised to call back or seek an in-person office evaluation/go to MAU at [facility] for any urgent or concerning symptoms. Please refer to After Visit Summary for other counseling recommendations.  I provided 22 minutes of non-face-to-face time during this encounter. Return in 4 weeks (on 03/17/2019) for [facility], virtual likely.  No future appointments.  Reva Bores, MD Center for [employer], [facility]

## 2019-02-17 NOTE — Patient Instructions (Signed)

## 2019-02-17 NOTE — Progress Notes (Signed)
I connected with  Allison Fritz on 02/17/19 by a video enabled telemedicine application and verified that I am speaking with the correct person using two identifiers.   I discussed the limitations of evaluation and management by telemedicine. The patient expressed understanding and agreed to proceed.   Has signed up for babyscripts and has not gotten cuff yet; will call us if she does not get it soon. Reviewed bp parameters with her. Explained she will be called with her [place] appt. Explained how to download webex app and start virtual visit with provider.

## 2019-02-26 ENCOUNTER — Other Ambulatory Visit: Payer: Self-pay

## 2019-02-26 ENCOUNTER — Ambulatory Visit (HOSPITAL_COMMUNITY)
Admission: RE | Admit: 2019-02-26 | Discharge: 2019-02-26 | Disposition: A | Discharge status: Home / Self Care | Payer: Medicaid Other | Source: Ambulatory Visit | Attending: Obstetrics and Gynecology | Admitting: Obstetrics and Gynecology

## 2019-02-26 DIAGNOSIS — O30049 Twin pregnancy, dichorionic/diamniotic, unspecified trimester: Secondary | ICD-10-CM | POA: Diagnosis not present

## 2019-02-26 DIAGNOSIS — Z3A19 19 weeks gestation of pregnancy: Secondary | ICD-10-CM

## 2019-02-26 DIAGNOSIS — O30042 Twin pregnancy, dichorionic/diamniotic, second trimester: Secondary | ICD-10-CM | POA: Diagnosis not present

## 2019-02-26 DIAGNOSIS — Z363 Encounter for antenatal screening for malformations: Secondary | ICD-10-CM | POA: Diagnosis not present

## 2019-02-27 ENCOUNTER — Other Ambulatory Visit (HOSPITAL_COMMUNITY): Payer: Self-pay | Admitting: *Deleted

## 2019-02-27 DIAGNOSIS — O30042 Twin pregnancy, dichorionic/diamniotic, second trimester: Secondary | ICD-10-CM

## 2019-03-20 ENCOUNTER — Encounter: Payer: Self-pay | Admitting: Obstetrics and Gynecology

## 2019-03-20 ENCOUNTER — Telehealth (INDEPENDENT_AMBULATORY_CARE_PROVIDER_SITE_OTHER): Payer: Medicaid Other | Admitting: Obstetrics and Gynecology

## 2019-03-20 ENCOUNTER — Other Ambulatory Visit: Payer: Self-pay

## 2019-03-20 VITALS — BP 106/78 | HR 92

## 2019-03-20 DIAGNOSIS — O30049 Twin pregnancy, dichorionic/diamniotic, unspecified trimester: Secondary | ICD-10-CM

## 2019-03-20 DIAGNOSIS — Z3A22 22 weeks gestation of pregnancy: Secondary | ICD-10-CM

## 2019-03-20 DIAGNOSIS — O0990 Supervision of high risk pregnancy, unspecified, unspecified trimester: Secondary | ICD-10-CM

## 2019-03-20 DIAGNOSIS — O30042 Twin pregnancy, dichorionic/diamniotic, second trimester: Secondary | ICD-10-CM

## 2019-03-20 NOTE — Progress Notes (Signed)
I connected with  [name] on 03/20/19 at  9:35 AM EDT by telephone and verified that I am speaking with the correct person using two identifiers.   I discussed the limitations, risks, security and privacy concerns of performing an evaluation and management service by telephone and the availability of in person appointments. I also discussed with the patient that there may be a patient responsible charge related to this service. The patient expressed understanding and agreed to proceed.  Ernestina Patches, CMA 03/20/2019  9:47 AM

## 2019-03-20 NOTE — Progress Notes (Signed)
   TELEHEALTH OBSTETRICS PRENATAL VIRTUAL VIDEO VISIT ENCOUNTER NOTE  Provider location: Center for [employer] at [facility]   I connected with Allison Fritz on 03/20/19 at  9:35 AM EDT by MyChart Video Encounter at home and verified that I am speaking with the correct person using two identifiers.   I discussed the limitations, risks, security and privacy concerns of performing an evaluation and management service by telephone and the availability of in person appointments. I also discussed with the patient that there may be a patient responsible charge related to this service. The patient expressed understanding and agreed to proceed. Subjective:  Allison Fritz is a 28 y.o. G3P1011 at [redacted]w[redacted]d being seen today for ongoing prenatal care.  She is currently monitored for the following issues for this high-risk pregnancy and has Left shoulder pain; Supervision of high risk pregnancy, antepartum; and Dichorionic diamniotic twin pregnancy, antepartum on their problem list.  Patient reports occasional cramping in lower abdomen, comes and goes, will come on and stay for awhile, then go away, not regular cramping, she notices it before she goes to bed. Is drinking only 3-4 20 oz bottles of water per day  .  Contractions: Irritability. Vag. Bleeding: None.  Movement: Present. Denies any leaking of fluid.   The following portions of the patient's history were reviewed and updated as appropriate: allergies, current medications, past family history, past medical history, past social history, past surgical history and problem list.   Objective:   Vitals:   03/20/19 0950  BP: 106/78  Pulse: 92    Fetal Status:     Movement: Present     General:  Alert, oriented and cooperative. Patient is in no acute distress.  Respiratory: Normal respiratory effort, no problems with respiration noted  Mental Status: Normal mood and affect. Normal behavior. Normal judgment and thought content.  Rest of  physical exam deferred due to type of encounter  Imaging:   Assessment and Plan:  Pregnancy: G3P1011 at [redacted]w[redacted]d  1. Supervision of high risk pregnancy, antepartum Never had NOB labs drawn, will have come in to get them Okay with horizon, declines panorama - encouraged increased water intake for cramping, to present to MAU with any issues  2. Dichorionic diamniotic twin pregnancy, antepartum Normal growth/discordance   Preterm labor symptoms and general obstetric precautions including but not limited to vaginal bleeding, contractions, leaking of fluid and fetal movement were reviewed in detail with the patient. I discussed the assessment and treatment plan with the patient. The patient was provided an opportunity to ask questions and all were answered. The patient agreed with the plan and demonstrated an understanding of the instructions. The patient was advised to call back or seek an in-person office evaluation/go to MAU at [facility] for any urgent or concerning symptoms. Please refer to After Visit Summary for other counseling recommendations.   I provided 15 minutes of face-to-face time during this encounter.  Return in about 2 weeks (around 04/03/2019) for OB visit (MD), virtual.  Future Appointments  Date Time Provider Department Center  03/28/2019  3:00 PM WH-MFC [place] 3 WH-MFCUS MFC-US    Conan Bowens, MD Center for [facility], [facility]

## 2019-03-21 ENCOUNTER — Other Ambulatory Visit: Payer: Self-pay

## 2019-03-21 ENCOUNTER — Other Ambulatory Visit: Payer: Medicaid Other

## 2019-03-24 ENCOUNTER — Other Ambulatory Visit: Payer: Self-pay

## 2019-03-24 ENCOUNTER — Other Ambulatory Visit: Payer: Medicaid Other

## 2019-03-25 ENCOUNTER — Other Ambulatory Visit: Payer: Medicaid Other

## 2019-03-25 LAB — OBSTETRIC PANEL, INCLUDING HIV
Antibody Screen: NEGATIVE
Basophils Absolute: 0 10*3/uL (ref 0.0–0.2)
Basos: 0 %
EOS (ABSOLUTE): 0 10*3/uL (ref 0.0–0.4)
Eos: 1 %
HIV Screen 4th Generation wRfx: NONREACTIVE
Hematocrit: 27.1 % — ABNORMAL LOW (ref 34.0–46.6)
Hemoglobin: 8.9 g/dL — ABNORMAL LOW (ref 11.1–15.9)
Hepatitis B Surface Ag: NEGATIVE
Immature Grans (Abs): 0 10*3/uL (ref 0.0–0.1)
Immature Granulocytes: 0 %
Lymphocytes Absolute: 1.6 10*3/uL (ref 0.7–3.1)
Lymphs: 24 %
MCH: 31.1 pg (ref 26.6–33.0)
MCHC: 32.8 g/dL (ref 31.5–35.7)
MCV: 95 fL (ref 79–97)
Monocytes Absolute: 0.5 10*3/uL (ref 0.1–0.9)
Monocytes: 7 %
Neutrophils Absolute: 4.5 10*3/uL (ref 1.4–7.0)
Neutrophils: 68 %
Platelets: 229 10*3/uL (ref 150–450)
RBC: 2.86 x10E6/uL — ABNORMAL LOW (ref 3.77–5.28)
RDW: 13 % (ref 11.7–15.4)
RPR Ser Ql: NONREACTIVE
Rh Factor: POSITIVE
Rubella Antibodies, IGG: 2.06 index (ref >0.99)
WBC: 6.7 10*3/uL (ref 3.4–10.8)

## 2019-03-28 ENCOUNTER — Ambulatory Visit (HOSPITAL_COMMUNITY)
Admission: RE | Admit: 2019-03-28 | Discharge: 2019-03-28 | Disposition: A | Discharge status: Home / Self Care | Payer: Medicaid Other | Source: Ambulatory Visit | Attending: Obstetrics and Gynecology | Admitting: Obstetrics and Gynecology

## 2019-03-28 ENCOUNTER — Other Ambulatory Visit: Payer: Self-pay

## 2019-03-28 DIAGNOSIS — Z362 Encounter for other antenatal screening follow-up: Secondary | ICD-10-CM

## 2019-03-28 DIAGNOSIS — O30042 Twin pregnancy, dichorionic/diamniotic, second trimester: Secondary | ICD-10-CM | POA: Diagnosis present

## 2019-03-28 DIAGNOSIS — Z3A23 23 weeks gestation of pregnancy: Secondary | ICD-10-CM

## 2019-03-31 ENCOUNTER — Other Ambulatory Visit (HOSPITAL_COMMUNITY): Payer: Self-pay | Admitting: *Deleted

## 2019-03-31 DIAGNOSIS — O30042 Twin pregnancy, dichorionic/diamniotic, second trimester: Secondary | ICD-10-CM

## 2019-03-31 MED ORDER — FERROUS SULFATE 325 (65 FE) MG PO TABS: 325.0000 mg | ORAL_TABLET | Freq: Two times a day (BID) | ORAL | 1 refills | Status: DC

## 2019-03-31 NOTE — Addendum Note (Signed)
Addended by: Vivien Rota on: 03/31/2019 10:43 AM   Modules accepted: Orders

## 2019-04-03 ENCOUNTER — Telehealth: Payer: Self-pay | Admitting: Family Medicine

## 2019-04-03 NOTE — Telephone Encounter (Signed)
Spoke to patient she is aware that her visit on 04-04-2019 is a virtual Mychart visit and patient have the app downloaded.

## 2019-04-04 ENCOUNTER — Telehealth (INDEPENDENT_AMBULATORY_CARE_PROVIDER_SITE_OTHER): Payer: Medicaid Other | Admitting: Obstetrics and Gynecology

## 2019-04-04 ENCOUNTER — Other Ambulatory Visit: Payer: Self-pay

## 2019-04-04 DIAGNOSIS — Z3A24 24 weeks gestation of pregnancy: Secondary | ICD-10-CM

## 2019-04-04 DIAGNOSIS — O30049 Twin pregnancy, dichorionic/diamniotic, unspecified trimester: Secondary | ICD-10-CM

## 2019-04-04 DIAGNOSIS — O30042 Twin pregnancy, dichorionic/diamniotic, second trimester: Secondary | ICD-10-CM

## 2019-04-04 DIAGNOSIS — O0990 Supervision of high risk pregnancy, unspecified, unspecified trimester: Secondary | ICD-10-CM

## 2019-04-04 MED ORDER — DOCUSATE SODIUM 50 MG PO CAPS: 50.0000 mg | ORAL_CAPSULE | Freq: Two times a day (BID) | ORAL | 2 refills | Status: DC

## 2019-04-04 NOTE — Progress Notes (Signed)
   TELEHEALTH VIRTUAL OBSTETRICS VISIT ENCOUNTER NOTE  I connected with Allison Fritz on 04/04/19 at 10:55 AM EDT by telephone at home and verified that I am speaking with the correct person using two identifiers.   I discussed the limitations, risks, security and privacy concerns of performing an evaluation and management service by telephone and the availability of in person appointments. I also discussed with the patient that there may be a patient responsible charge related to this service. The patient expressed understanding and agreed to proceed.  Subjective:  Allison Fritz is a 28 y.o. G3P1011 at [redacted]w[redacted]d being followed for ongoing prenatal care.  She is currently monitored for the following issues for this high-risk pregnancy and has Left shoulder pain; Supervision of high risk pregnancy, antepartum; and Dichorionic diamniotic twin pregnancy, antepartum on their problem list.  Patient reports no complaints. Reports fetal movement. Denies any contractions, bleeding or leaking of fluid.   The following portions of the patient's history were reviewed and updated as appropriate: allergies, current medications, past family history, past medical history, past social history, past surgical history and problem list.   Objective:  There were no vitals filed for this visit.  Babyscripts Data Reviewed: yes  General:  Alert, oriented and cooperative.   Mental Status: Normal mood and affect perceived. Normal judgment and thought content.  Rest of physical exam deferred due to type of encounter  Assessment and Plan:  Pregnancy: G3P1011 at [redacted]w[redacted]d 1. Dichorionic diamniotic twin pregnancy, antepartum Normal growth u/s last week. Has rpt growth for next month  2. Supervision of high risk pregnancy, antepartum 28wk labs nv.   Preterm labor symptoms and general obstetric precautions including but not limited to vaginal bleeding, contractions, leaking of fluid and fetal movement were reviewed in  detail with the patient.  I discussed the assessment and treatment plan with the patient. The patient was provided an opportunity to ask questions and all were answered. The patient agreed with the plan and demonstrated an understanding of the instructions. The patient was advised to call back or seek an in-person office evaluation/go to MAU at [facility] for any urgent or concerning symptoms. Please refer to After Visit Summary for other counseling recommendations.   I provided 7 minutes of non-face-to-face time during this encounter. The visit was conducted via MyChart-medicine  Return in about 19 days (around 04/23/2019).  Future Appointments  Date Time Provider [place]  04/23/2019  8:00 AM [place] MFC-US  04/23/2019  8:00 AM WH-MFC [place] 3 WH-MFCUS MFC-US    Con Arganbright, MD Center for [facility], [place]

## 2019-04-04 NOTE — Progress Notes (Signed)
I connected with  Allison Fritz on 04/04/19 at 10:55 AM EDT by telephone and verified that I am speaking with the correct person using two identifiers.   I discussed the limitations, risks, security and privacy concerns of performing an evaluation and management service by telephone and the availability of in person appointments. I also discussed with the patient that there may be a patient responsible charge related to this service. The patient expressed understanding and agreed to proceed.  [place], CMA 04/04/2019  10:48 AM   Just picked up [employer] on 04/03/2019, worried about constipation and sent colace to pharmacy will put Bp in baby rx.

## 2019-04-09 ENCOUNTER — Telehealth: Payer: Self-pay | Admitting: Family Medicine

## 2019-04-09 NOTE — Telephone Encounter (Signed)
The patient called in regarding her FMLA documents. Stated she will come in today to pick them up by 11:30am

## 2019-04-14 ENCOUNTER — Telehealth (INDEPENDENT_AMBULATORY_CARE_PROVIDER_SITE_OTHER): Payer: Medicaid Other | Admitting: *Deleted

## 2019-04-14 DIAGNOSIS — O0990 Supervision of high risk pregnancy, unspecified, unspecified trimester: Secondary | ICD-10-CM

## 2019-04-14 NOTE — Telephone Encounter (Signed)
Called pt regarding babyscripts.  Per review, noted that pt has not logged a blood pressure in the app since 03/20/19.  Pt states she will log one when she wakes up.  Mychart message sent as a reminder.

## 2019-04-15 ENCOUNTER — Encounter: Payer: Self-pay | Admitting: *Deleted

## 2019-04-21 DIAGNOSIS — Z029 Encounter for administrative examinations, unspecified: Secondary | ICD-10-CM

## 2019-04-23 ENCOUNTER — Other Ambulatory Visit: Payer: Self-pay

## 2019-04-23 ENCOUNTER — Other Ambulatory Visit: Payer: Self-pay | Admitting: *Deleted

## 2019-04-23 ENCOUNTER — Ambulatory Visit (HOSPITAL_COMMUNITY)
Admission: RE | Admit: 2019-04-23 | Discharge: 2019-04-23 | Disposition: A | Discharge status: Home / Self Care | Payer: Medicaid Other | Source: Ambulatory Visit | Attending: Obstetrics and Gynecology | Admitting: Obstetrics and Gynecology

## 2019-04-23 ENCOUNTER — Ambulatory Visit (INDEPENDENT_AMBULATORY_CARE_PROVIDER_SITE_OTHER): Payer: Medicaid Other | Admitting: Family Medicine

## 2019-04-23 ENCOUNTER — Other Ambulatory Visit: Payer: Medicaid Other

## 2019-04-23 ENCOUNTER — Other Ambulatory Visit (HOSPITAL_COMMUNITY): Payer: Self-pay | Admitting: *Deleted

## 2019-04-23 ENCOUNTER — Encounter (HOSPITAL_COMMUNITY): Payer: Self-pay

## 2019-04-23 ENCOUNTER — Ambulatory Visit (HOSPITAL_COMMUNITY): Payer: Medicaid Other | Admitting: *Deleted

## 2019-04-23 VITALS — BP 105/68 | HR 90 | Wt 157.1 lb

## 2019-04-23 VITALS — BP 108/76 | HR 101 | Temp 98.6°F

## 2019-04-23 DIAGNOSIS — Z3A27 27 weeks gestation of pregnancy: Secondary | ICD-10-CM

## 2019-04-23 DIAGNOSIS — Z362 Encounter for other antenatal screening follow-up: Secondary | ICD-10-CM

## 2019-04-23 DIAGNOSIS — O0992 Supervision of high risk pregnancy, unspecified, second trimester: Secondary | ICD-10-CM

## 2019-04-23 DIAGNOSIS — O30042 Twin pregnancy, dichorionic/diamniotic, second trimester: Secondary | ICD-10-CM

## 2019-04-23 DIAGNOSIS — O0990 Supervision of high risk pregnancy, unspecified, unspecified trimester: Secondary | ICD-10-CM

## 2019-04-23 DIAGNOSIS — O30043 Twin pregnancy, dichorionic/diamniotic, third trimester: Secondary | ICD-10-CM

## 2019-04-23 DIAGNOSIS — O30049 Twin pregnancy, dichorionic/diamniotic, unspecified trimester: Secondary | ICD-10-CM

## 2019-04-23 NOTE — Patient Instructions (Signed)

## 2019-04-23 NOTE — Progress Notes (Signed)
    PRENATAL VISIT NOTE  Subjective:  Allison Fritz is a 28 y.o. G3P1011 at [redacted]w[redacted]d being seen today for ongoing prenatal care.  She is currently monitored for the following issues for this high-risk pregnancy and has Left shoulder pain; Supervision of high risk pregnancy, antepartum; and Dichorionic diamniotic twin pregnancy, antepartum on their problem list.  Patient reports no complaints.  Contractions: Not present. Vag. Bleeding: None.  Movement: Present. Denies leaking of fluid.   The following portions of the patient's history were reviewed and updated as appropriate: allergies, current medications, past family history, past medical history, past social history, past surgical history and problem list.   Objective:   Vitals:   04/23/19 1047  BP: 105/68  Pulse: 90  Weight: 157 lb 1.6 oz (71.3 kg)    Fetal Status: Fetal Heart Rate (bpm): 141/132 Fundal Height: 35 cm Movement: Present     General:  Alert, oriented and cooperative. Patient is in no acute distress.  Skin: Skin is warm and dry. No rash noted.   Cardiovascular: Normal heart rate noted  Respiratory: Normal respiratory effort, no problems with respiration noted  Abdomen: Soft, gravid, appropriate for gestational age.  Pain/Pressure: Present     Pelvic: Cervical exam deferred        Extremities: Normal range of motion.  Edema: None  Mental Status: Normal mood and affect. Normal behavior. Normal judgment and thought content.   Assessment and Plan:  Pregnancy: G3P1011 at [redacted]w[redacted]d 1. Supervision of high risk pregnancy, antepartum LE swelling, support hose, structured shoes, avoid prolonged sitting or standing, elevate feet. Will return for 2 hour, threw it up today, to take anti-nausea meds prior  2. Dichorionic diamniotic twin pregnancy, antepartum Concordant growth A vtx 2 lb 4 oz, B trv 2 lb 5 oz  Preterm labor symptoms and general obstetric precautions including but not limited to vaginal bleeding, contractions,  leaking of fluid and fetal movement were reviewed in detail with the patient. Please refer to After Visit Summary for other counseling recommendations.   Return in 2 weeks (on 05/07/2019) for virtual.  Future Appointments  Date Time Provider [place]  04/29/2019  8:00 AM WOC-WOCA LAB WOC-WOCA WOC  05/14/2019 10:35 AM Aletha Halim, MD WOC-WOCA WOC  05/21/2019  8:00 AM [place] MFC-US  05/21/2019  8:00 AM WH-MFC [place] 3 WH-MFCUS MFC-US    Donnamae Jude, MD

## 2019-04-23 NOTE — Progress Notes (Signed)
Pt active in babyscripts and logging BP. Reminded pt that we recommend that she check her BP and log it weekly.  Pt undecided on Tdap-VIS given.

## 2019-04-28 ENCOUNTER — Other Ambulatory Visit: Payer: Self-pay | Admitting: *Deleted

## 2019-04-28 ENCOUNTER — Telehealth: Payer: Self-pay | Admitting: Obstetrics & Gynecology

## 2019-04-28 DIAGNOSIS — O0990 Supervision of high risk pregnancy, unspecified, unspecified trimester: Secondary | ICD-10-CM

## 2019-04-28 NOTE — Telephone Encounter (Signed)
Called the patient to complete the pre-screen. The patient answered no to COVID19 symptoms and/or being previously diagnosed. Informed the patient of the wearing a face mask, sanitizing hands at the sanitizing station upon entering our office, and no visitors or children are allowed due to the COVID19 restrictions. The patient verbalized understanding. °

## 2019-04-29 ENCOUNTER — Other Ambulatory Visit: Payer: Medicaid Other

## 2019-04-29 ENCOUNTER — Telehealth: Payer: Self-pay | Admitting: Student

## 2019-04-29 ENCOUNTER — Encounter: Payer: Self-pay | Admitting: Student

## 2019-04-29 NOTE — Telephone Encounter (Signed)
Called the patient to reschedule the appointment. The line answered and hung up. Mailing the patient a missed appointment letter.

## 2019-04-30 LAB — GLUCOSE TOLERANCE, 2 HOURS W/ 1HR: Glucose, Fasting: 86 mg/dL (ref 65–91)

## 2019-05-05 ENCOUNTER — Telehealth: Payer: Self-pay | Admitting: Obstetrics & Gynecology

## 2019-05-05 NOTE — Telephone Encounter (Signed)
The patient stated she would like a call regarding the 2-hour sugar test. She would like to know is there an alternative to drinking the liquid. She stated it's to much on her body when she doesn't eat and tries to come in to do the two-hour. She would like to speak with someone before she reschedules.

## 2019-05-05 NOTE — Telephone Encounter (Signed)
Called patient and discussed she may do the test again but with jelly beans. Reviewed process with patient. She verbalized understanding and states she can come Friday. Per Crystal, patient can come at 8:20. Patient had no questions

## 2019-05-09 ENCOUNTER — Other Ambulatory Visit: Payer: Self-pay

## 2019-05-09 ENCOUNTER — Other Ambulatory Visit: Payer: Medicaid Other

## 2019-05-09 DIAGNOSIS — O0990 Supervision of high risk pregnancy, unspecified, unspecified trimester: Secondary | ICD-10-CM

## 2019-05-10 LAB — GLUCOSE TOLERANCE, 2 HOURS W/ 1HR
Glucose, 1 hour: 124 mg/dL (ref 65–179)
Glucose, 2 hour: 112 mg/dL (ref 65–152)
Glucose, Fasting: 81 mg/dL (ref 65–91)

## 2019-05-14 ENCOUNTER — Telehealth (INDEPENDENT_AMBULATORY_CARE_PROVIDER_SITE_OTHER): Payer: Medicaid Other | Admitting: Obstetrics and Gynecology

## 2019-05-14 ENCOUNTER — Other Ambulatory Visit: Payer: Self-pay

## 2019-05-14 DIAGNOSIS — O0990 Supervision of high risk pregnancy, unspecified, unspecified trimester: Secondary | ICD-10-CM

## 2019-05-14 DIAGNOSIS — O0993 Supervision of high risk pregnancy, unspecified, third trimester: Secondary | ICD-10-CM

## 2019-05-14 DIAGNOSIS — O30043 Twin pregnancy, dichorionic/diamniotic, third trimester: Secondary | ICD-10-CM | POA: Diagnosis not present

## 2019-05-14 DIAGNOSIS — Z3A30 30 weeks gestation of pregnancy: Secondary | ICD-10-CM

## 2019-05-14 DIAGNOSIS — O30049 Twin pregnancy, dichorionic/diamniotic, unspecified trimester: Secondary | ICD-10-CM

## 2019-05-14 NOTE — Patient Instructions (Signed)
TED hoses or compression stocking  Try for 36mmHg  To 30 mmHg

## 2019-05-14 NOTE — Progress Notes (Signed)
   TELEHEALTH VIRTUAL OBSTETRICS VISIT ENCOUNTER NOTE  Clinic: [facility]  I connected with [name] on 05/14/19 at 10:35 AM EDT by telephone at home and verified that I am speaking with the correct person using two identifiers.   I discussed the limitations, risks, security and privacy concerns of performing an evaluation and management service by telephone and the availability of in person appointments. I also discussed with the patient that there may be a patient responsible charge related to this service. The patient expressed understanding and agreed to proceed.  Subjective:  Allison Fritz is a 28 y.o. G3P1011 at [redacted]w[redacted]d being followed for ongoing prenatal care.  She is currently monitored for the following issues for this high-risk pregnancy and has Left shoulder pain; Supervision of high risk pregnancy, antepartum; and Dichorionic diamniotic twin pregnancy, antepartum on their problem list.  Patient reports LE edema: symmetric. Reports fetal movement. Denies any contractions, bleeding or leaking of fluid.   The following portions of the patient's history were reviewed and updated as appropriate: allergies, current medications, past family history, past medical history, past social history, past surgical history and problem list.   Objective:   Vitals:   05/14/19 1043  BP: 110/70    Babyscripts Data Reviewed: yes  General:  Alert, oriented and cooperative.   Mental Status: Normal mood and affect perceived. Normal judgment and thought content.  Rest of physical exam deferred due to type of encounter  Assessment and Plan:  Pregnancy: G3P1011 at [redacted]w[redacted]d 1. Supervision of high risk pregnancy, antepartum Routine care.   2. Dichorionic diamniotic twin pregnancy, antepartum Surveillance growth next week. D/w pt more re: delivery options nv  Preterm labor symptoms and general obstetric precautions including but not limited to vaginal bleeding,  contractions, leaking of fluid and fetal movement were reviewed in detail with the patient.  I discussed the assessment and treatment plan with the patient. The patient was provided an opportunity to ask questions and all were answered. The patient agreed with the plan and demonstrated an understanding of the instructions. The patient was advised to call back or seek an in-person office evaluation/go to MAU at [facility] for any urgent or concerning symptoms. Please refer to After Visit Summary for other counseling recommendations.   I provided 10 minutes of non-face-to-face time during this encounter. The visit was conducted via [place]  Return in about 1 week (around 05/21/2019).  Future Appointments  Date Time Provider [place]  05/21/2019  8:00 AM [place] MFC-US  05/21/2019  8:00 AM WH-MFC [place] 3 WH-MFCUS MFC-US    Edrei Norgaard, MD [facility] [place]

## 2019-05-21 ENCOUNTER — Other Ambulatory Visit (HOSPITAL_COMMUNITY): Payer: Self-pay | Admitting: *Deleted

## 2019-05-21 ENCOUNTER — Other Ambulatory Visit: Payer: Self-pay

## 2019-05-21 ENCOUNTER — Ambulatory Visit (HOSPITAL_COMMUNITY): Payer: Medicaid Other | Admitting: *Deleted

## 2019-05-21 ENCOUNTER — Ambulatory Visit: Payer: Medicaid Other | Admitting: Obstetrics and Gynecology

## 2019-05-21 ENCOUNTER — Ambulatory Visit (HOSPITAL_COMMUNITY)
Admission: RE | Admit: 2019-05-21 | Discharge: 2019-05-21 | Disposition: A | Discharge status: Home / Self Care | Payer: Medicaid Other | Source: Ambulatory Visit | Attending: Maternal & Fetal Medicine | Admitting: Maternal & Fetal Medicine

## 2019-05-21 ENCOUNTER — Encounter (HOSPITAL_COMMUNITY): Payer: Self-pay

## 2019-05-21 VITALS — BP 113/75 | HR 96 | Temp 97.9°F | Wt 166.8 lb

## 2019-05-21 DIAGNOSIS — O0993 Supervision of high risk pregnancy, unspecified, third trimester: Secondary | ICD-10-CM

## 2019-05-21 DIAGNOSIS — O30043 Twin pregnancy, dichorionic/diamniotic, third trimester: Secondary | ICD-10-CM

## 2019-05-21 DIAGNOSIS — Z3A31 31 weeks gestation of pregnancy: Secondary | ICD-10-CM | POA: Diagnosis not present

## 2019-05-21 DIAGNOSIS — Z363 Encounter for antenatal screening for malformations: Secondary | ICD-10-CM

## 2019-05-21 DIAGNOSIS — O0990 Supervision of high risk pregnancy, unspecified, unspecified trimester: Secondary | ICD-10-CM

## 2019-05-21 DIAGNOSIS — O30049 Twin pregnancy, dichorionic/diamniotic, unspecified trimester: Secondary | ICD-10-CM

## 2019-05-21 NOTE — Progress Notes (Signed)
Prenatal Visit Note Date: 05/21/2019 Clinic: [facility]  Subjective:  Allison Fritz is a 28 y.o. G3P1011 at [redacted]w[redacted]d being seen today for ongoing prenatal care.  She is currently monitored for the following issues for this high-risk pregnancy and has Left shoulder pain; Supervision of high risk pregnancy, antepartum; and Dichorionic diamniotic twin pregnancy, antepartum on their problem list.  Patient reports no complaints.   Contractions: Irritability. Vag. Bleeding: None.  Movement: Present. Denies leaking of fluid.   The following portions of the patient's history were reviewed and updated as appropriate: allergies, current medications, past family history, past medical history, past social history, past surgical history and problem list. Problem list updated.  Objective:   Vitals:   05/21/19 0956  BP: 113/75  Pulse: 96  Temp: 97.9 F (36.6 C)  Weight: 166 lb 12.8 oz (75.7 kg)    Fetal Status: Fetal Heart Rate (bpm): 146/154   Movement: Present     General:  Alert, oriented and cooperative. Patient is in no acute distress.  Skin: Skin is warm and dry. No rash noted.   Cardiovascular: Normal heart rate noted  Respiratory: Normal respiratory effort, no problems with respiration noted  Abdomen: Soft, gravid, appropriate for gestational age. Pain/Pressure: Present     Pelvic:  Cervical exam deferred        Extremities: Normal range of motion.  Edema: Trace  Mental Status: Normal mood and affect. Normal behavior. Normal judgment and thought content.   Urinalysis:      Assessment and Plan:  Pregnancy: G3P1011 at [redacted]w[redacted]d  1. Dichorionic diamniotic twin pregnancy, antepartum Routine care. Normal growth scan and discordance today. D/w her re: delivery mode. Can d/w her more  2. Supervision of high risk pregnancy, antepartum Routine care. Normal babyscripts data  Preterm labor symptoms and general obstetric precautions including but not limited to vaginal  bleeding, contractions, leaking of fluid and fetal movement were reviewed in detail with the patient. Please refer to After Visit Summary for other counseling recommendations.  Return in about 2 weeks (around 06/04/2019) for 2wk virtual visit hrob.   Aletha Halim, MD

## 2019-05-27 ENCOUNTER — Telehealth: Payer: Self-pay

## 2019-05-27 NOTE — Telephone Encounter (Signed)
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 Pt wants a letter requesting that she work from home virtually to stay off her feet due to swelling. Pt states she can't even put her compression socs on due to the swelling, she asks if letter can be sent thru My Chart.

## 2019-05-28 NOTE — Telephone Encounter (Signed)
Does she want a school note to do virtual schooling? If so, then that's fine.

## 2019-05-29 ENCOUNTER — Telehealth: Payer: Self-pay | Admitting: Obstetrics and Gynecology

## 2019-05-29 NOTE — Telephone Encounter (Signed)
Patient was calling checking on a letter about being able to work vis video. She works at [employer]. Also is requesting to be seen sooner than the 18th. Her mom told her she was dropping, and should be seen sooner. She stated she was having some pain last night every 5 to 10 minutes. However, she was able to fall asleep.

## 2019-05-30 ENCOUNTER — Encounter: Payer: Self-pay | Admitting: *Deleted

## 2019-05-30 NOTE — Progress Notes (Unsigned)
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 I assured her that process was in place for her safety. Pt verbalized understanding.

## 2019-06-03 ENCOUNTER — Telehealth: Payer: Self-pay | Admitting: Obstetrics and Gynecology

## 2019-06-03 NOTE — Telephone Encounter (Signed)
The patient called in stating she thinks she has preeclampsia. She also stated she cant get out of bed without help and its very difficult for her to walk. She would like to speak with a nurse about coming in or what she should do.

## 2019-06-03 NOTE — Telephone Encounter (Signed)
The patient also stated she will be at work until 3:30 please call her at work. The work number is listed in the chart. The ext is [phone]. She gets off work at 3:30 and she would like to come in today.

## 2019-06-03 NOTE — Telephone Encounter (Signed)
Patient called the office back to state she has not received a call back and is experiencing lots of painful swelling in her hands and feet had patient take her blood pressure it was 134/70 with a pulse of 82. States her urine is dark and doesn't think it has a smell and that she doesn't want to go to MAU. Advised that I don't feel like she needs to go to MAU for swelling because they usually send her away. I advised her not to go to mau and I  asked Lorriane Shire to add her to [facility] schedule for tomorrow. Patient verbalized understanding and stated that she will be here at 2pm.

## 2019-06-04 ENCOUNTER — Ambulatory Visit (INDEPENDENT_AMBULATORY_CARE_PROVIDER_SITE_OTHER): Payer: Medicaid Other | Admitting: Obstetrics & Gynecology

## 2019-06-04 ENCOUNTER — Encounter: Payer: Self-pay | Admitting: Family Medicine

## 2019-06-04 ENCOUNTER — Inpatient Hospital Stay (HOSPITAL_COMMUNITY)
Admission: AD | Admit: 2019-06-04 | Discharge: 2019-06-04 | Disposition: A | Discharge status: Home / Self Care | Payer: BC Managed Care – PPO | Attending: Obstetrics & Gynecology | Admitting: Obstetrics & Gynecology

## 2019-06-04 ENCOUNTER — Other Ambulatory Visit: Payer: Self-pay

## 2019-06-04 ENCOUNTER — Encounter (HOSPITAL_COMMUNITY): Payer: Self-pay | Admitting: *Deleted

## 2019-06-04 VITALS — BP 147/93 | HR 103 | Wt 177.0 lb

## 2019-06-04 DIAGNOSIS — O26893 Other specified pregnancy related conditions, third trimester: Secondary | ICD-10-CM | POA: Diagnosis not present

## 2019-06-04 DIAGNOSIS — O30049 Twin pregnancy, dichorionic/diamniotic, unspecified trimester: Secondary | ICD-10-CM

## 2019-06-04 DIAGNOSIS — O0993 Supervision of high risk pregnancy, unspecified, third trimester: Secondary | ICD-10-CM

## 2019-06-04 DIAGNOSIS — R030 Elevated blood-pressure reading, without diagnosis of hypertension: Secondary | ICD-10-CM | POA: Diagnosis present

## 2019-06-04 DIAGNOSIS — O133 Gestational [pregnancy-induced] hypertension without significant proteinuria, third trimester: Secondary | ICD-10-CM | POA: Diagnosis not present

## 2019-06-04 DIAGNOSIS — Z3A33 33 weeks gestation of pregnancy: Secondary | ICD-10-CM

## 2019-06-04 DIAGNOSIS — O99013 Anemia complicating pregnancy, third trimester: Secondary | ICD-10-CM | POA: Diagnosis not present

## 2019-06-04 DIAGNOSIS — O30043 Twin pregnancy, dichorionic/diamniotic, third trimester: Secondary | ICD-10-CM | POA: Diagnosis not present

## 2019-06-04 DIAGNOSIS — O0990 Supervision of high risk pregnancy, unspecified, unspecified trimester: Secondary | ICD-10-CM

## 2019-06-04 DIAGNOSIS — D649 Anemia, unspecified: Secondary | ICD-10-CM | POA: Diagnosis not present

## 2019-06-04 DIAGNOSIS — Z8759 Personal history of other complications of pregnancy, childbirth and the puerperium: Secondary | ICD-10-CM

## 2019-06-04 LAB — CBC WITH DIFFERENTIAL/PLATELET
Abs Immature Granulocytes: 0.01 10*3/uL (ref 0.00–0.07)
Basophils Absolute: 0 10*3/uL (ref 0.0–0.1)
Basophils Relative: 0 %
Eosinophils Absolute: 0 10*3/uL (ref 0.0–0.5)
Eosinophils Relative: 0 %
HCT: 21.7 % — ABNORMAL LOW (ref 36.0–46.0)
Hemoglobin: 6.9 g/dL — CL (ref 12.0–15.0)
Immature Granulocytes: 0 %
Lymphocytes Relative: 22 %
Lymphs Abs: 1.3 10*3/uL (ref 0.7–4.0)
MCH: 26 pg (ref 26.0–34.0)
MCHC: 31.8 g/dL (ref 30.0–36.0)
MCV: 81.9 fL (ref 80.0–100.0)
Monocytes Absolute: 0.5 10*3/uL (ref 0.1–1.0)
Monocytes Relative: 8 %
Neutro Abs: 3.9 10*3/uL (ref 1.7–7.7)
Neutrophils Relative %: 70 %
Platelets: 182 10*3/uL (ref 150–400)
RBC: 2.65 MIL/uL — ABNORMAL LOW (ref 3.87–5.11)
RDW: 14.4 % (ref 11.5–15.5)
WBC: 5.7 10*3/uL (ref 4.0–10.5)
nRBC: 0.4 % — ABNORMAL HIGH (ref 0.0–0.2)

## 2019-06-04 LAB — URINALYSIS, ROUTINE W REFLEX MICROSCOPIC
Bilirubin Urine: NEGATIVE
Glucose, UA: NEGATIVE mg/dL
Hgb urine dipstick: NEGATIVE
Ketones, ur: NEGATIVE mg/dL
Leukocytes,Ua: NEGATIVE
Nitrite: NEGATIVE
Protein, ur: NEGATIVE mg/dL
Specific Gravity, Urine: 1.003 — ABNORMAL LOW (ref 1.005–1.030)
pH: 7 (ref 5.0–8.0)

## 2019-06-04 LAB — COMPREHENSIVE METABOLIC PANEL
ALT: 11 U/L (ref 0–44)
AST: 21 U/L (ref 15–41)
Albumin: 2.4 g/dL — ABNORMAL LOW (ref 3.5–5.0)
Alkaline Phosphatase: 172 U/L — ABNORMAL HIGH (ref 38–126)
Anion gap: 8 (ref 5–15)
BUN: <5 mg/dL — ABNORMAL LOW (ref 6–20)
CO2: 21 mmol/L — ABNORMAL LOW (ref 22–32)
Calcium: 8.6 mg/dL — ABNORMAL LOW (ref 8.9–10.3)
Chloride: 107 mmol/L (ref 98–111)
Creatinine, Ser: 0.68 mg/dL (ref 0.44–1.00)
GFR calc Af Amer: >60 mL/min (ref >60)
GFR calc non Af Amer: >60 mL/min (ref >60)
Glucose, Bld: 96 mg/dL (ref 70–99)
Potassium: 3.7 mmol/L (ref 3.5–5.1)
Sodium: 136 mmol/L (ref 135–145)
Total Bilirubin: 0.4 mg/dL (ref 0.3–1.2)
Total Protein: 5.4 g/dL — ABNORMAL LOW (ref 6.5–8.1)

## 2019-06-04 LAB — PROTEIN / CREATININE RATIO, URINE
Creatinine, Urine: 39.47 mg/dL
Total Protein, Urine: <6 mg/dL

## 2019-06-04 MED ORDER — BETAMETHASONE SOD PHOS & ACET 6 (3-3) MG/ML IJ SUSP
12.0000 mg | Freq: Once | INTRAMUSCULAR | Status: AC
  Administered 2019-06-04: 12 mg via INTRAMUSCULAR
  Filled 2019-06-04: qty 2

## 2019-06-04 MED ORDER — SODIUM CHLORIDE 0.9 % IV SOLN
510.0000 mg | Freq: Once | INTRAVENOUS | Status: AC
  Administered 2019-06-04: 510 mg via INTRAVENOUS
  Filled 2019-06-04: qty 17

## 2019-06-04 MED ORDER — SODIUM CHLORIDE 0.9 % IV SOLN
INTRAVENOUS | Status: DC
  Administered 2019-06-04: 20:00:00 via INTRAVENOUS

## 2019-06-04 NOTE — Discharge Instructions (Signed)

## 2019-06-04 NOTE — MAU Note (Signed)
Received call from lab, hgb 6.9 critical value. This RN informs Dr Dione Plover of value. No new orders recieved

## 2019-06-04 NOTE — MAU Note (Signed)
.   Allison Fritz is a 28 y.o. at [redacted]w[redacted]d here in MAU reporting: she was sent over for blood pressure evaluation for PRE-E. Denies any headache or blurred vision. Legs are hurting due to swelling   Pain score: 8 Vitals:   06/04/19 1619  BP: (!) 141/91  Pulse: (!) 101  Resp: 16  Temp: 98.1 F (36.7 C)     FHT: A- 135 B-140 Lab orders placed from triage: UA

## 2019-06-04 NOTE — Progress Notes (Signed)
    PRENATAL VISIT NOTE  Subjective:  Allison Fritz is a 28 y.o. G3P1011 at [redacted]w[redacted]d being seen today for ongoing prenatal care.  She is currently monitored for the following issues for this high-risk pregnancy and has Left shoulder pain; Supervision of high risk pregnancy, antepartum; and Dichorionic diamniotic twin pregnancy, antepartum on their problem list.  Patient reports severe edema in her lower legs for 7 days, she denies headache, visual changes, and RUQ pain.  Contractions: Irritability. Vag. Bleeding: None.  Movement: Present. Denies leaking of fluid.   The following portions of the patient's history were reviewed and updated as appropriate: allergies, current medications, past family history, past medical history, past social history, past surgical history and problem list.   Objective:   Vitals:   06/04/19 1431  BP: (!) 147/93  Pulse: (!) 103  Weight: 177 lb (80.3 kg)    Fetal Status: Fetal Heart Rate (bpm): 150/154   Movement: Present     General:  Alert, oriented and cooperative. Patient is in no acute distress.  Skin: Skin is warm and dry. No rash noted.   Cardiovascular: Normal heart rate noted  Respiratory: Normal respiratory effort, no problems with respiration noted  Abdomen: Soft, gravid, appropriate for gestational age.  Pain/Pressure: Present     Pelvic: Cervical exam deferred        Extremities: Normal range of motion.  Edema: Very deep pitting, indentation lasts a long time  Mental Status: Normal mood and affect. Normal behavior. Normal judgment and thought content.   Assessment and Plan:  Pregnancy: G3P1011 at [redacted]w[redacted]d 1. Dichorionic diamniotic twin pregnancy, antepartum  2. Supervision of high risk pregnancy, antepartum - She will go to MAU for further evaluation.  Preterm labor symptoms and general obstetric precautions including but not limited to vaginal bleeding, contractions, leaking of fluid and fetal movement were reviewed in detail with the  patient. Please refer to After Visit Summary for other counseling recommendations.   No follow-ups on file.  Future Appointments  Date Time Provider [place]  06/10/2019 11:15 AM Donnamae Jude, MD [place]  06/25/2019  8:15 AM WH-MFC NURSE [place] MFC-US  06/25/2019  8:15 AM WH-MFC [place] 4 WH-MFCUS MFC-US  07/02/2019  8:00 AM WH-MFC NURSE [place] MFC-US  07/02/2019  8:00 AM WH-MFC [place] 5 WH-MFCUS MFC-US    Emily Filbert, MD

## 2019-06-04 NOTE — Progress Notes (Signed)
Swelling in feet  x7 days will not subside

## 2019-06-04 NOTE — MAU Provider Note (Signed)
History     [id number]  Arrival date and time: 06/04/19 1547   No chief complaint on file.    HPI Allison Fritz is a 28 y.o. at 58106w0d by 6wk US, who presents for for elevated BP.   Di/Di twin pregnancy Normal prenatal testing to date, most recent ultrasound on 05/21/2019 with normal growth and 6% discordance, normal AFI, twin A cephalic presentation, twin B oblique  Today reports she has been feeling fine except for some swelling in her legs which has been present for several weeks  Headache: No Vision changes: No Chest pain: No SOB: No RUQ pain: No Leg swelling: Yes  Vaginal bleeding: No LOF: No Fetal Movement: Yes Contractions: No  O/Positive/-- (06/01 0915)  OB History    Gravida  3   Para  1   Term  1   Preterm  0   AB  1   Living  1     SAB  1   TAB  0   Ectopic  0   Multiple  0   Live Births  1           Past Medical History:  Diagnosis Date  . Blood pressure elevated without history of HTN   . Medical history non-contributory   . Miscarriage 09/12/2016    Past Surgical History:  Procedure Laterality Date  . WISDOM TOOTH EXTRACTION      Family History  Problem Relation Age of Onset  . Hypertension Mother   . Hypertension Father   . Diabetes Maternal Grandmother   . Diabetes Paternal Grandmother     Social History   Socioeconomic History  . Marital status: Significant Other    Spouse name: Not on file  . Number of children: 1  . Years of education: College  . Highest education level: Not on file  Occupational History  . Not on file  Social Needs  . Financial resource strain: Not on file  . Food insecurity    Worry: Not on file    Inability: Not on file  . Transportation needs    Medical: Not on file    Non-medical: Not on file  Tobacco Use  . Smoking status: Never Smoker  . Smokeless tobacco: Never Used  Substance and Sexual Activity  . Alcohol use: No    Alcohol/week: 0.0 standard drinks  . Drug use: No   . Sexual activity: Yes    Birth control/protection: None  Lifestyle  . Physical activity    Days per week: Not on file    Minutes per session: Not on file  . Stress: Not on file  Relationships  . Social Musicianconnections    Talks on phone: Not on file    Gets together: Not on file    Attends religious service: Not on file    Active member of club or organization: Not on file    Attends meetings of clubs or organizations: Not on file    Relationship status: Not on file  . Intimate partner violence    Fear of current or ex partner: Not on file    Emotionally abused: Not on file    Physically abused: Not on file    Forced sexual activity: Not on file  Other Topics Concern  . Not on file  Social History Narrative   Drinks 1 cup of coffee 2-3 times a week. Has not had soda in 3 weeks 11/25/15   Has one daughter born in 2012 "khloe"  Works for [employer] at [place]    Completed associates degree   Single   Enjoys shopping, traveling, spending time with family.     Allergies  Allergen Reactions  . Sulfa Antibiotics Anaphylaxis and Hives    No current facility-administered medications on file prior to encounter.    Current Outpatient Medications on File Prior to Encounter  Medication Sig Dispense Refill  . cyclobenzaprine (FLEXERIL) 10 MG tablet Take 1 tablet (10 mg total) by mouth 3 (three) times daily. (Patient not taking: Reported on 02/12/2019) 15 tablet 0  . docusate sodium (COLACE) 50 MG capsule Take 1 capsule (50 mg total) by mouth 2 (two) times daily. 10 capsule 2  . Doxylamine-Pyridoxine (DICLEGIS) 10-10 MG TBEC Take 2 tabs at bedtime if symptoms persists add 1 tab in the morning and 1 tab in the afternoon (Patient not taking: Reported on 01/14/2019) 60 tablet 3  . ferrous sulfate (FERROUSUL) 325 (65 FE) MG tablet Take 1 tablet (325 mg total) by mouth 2 (two) times daily. 60 tablet 1  . Multiple Vitamin (MULTIVITAMIN) tablet Take 1 tablet by mouth daily.    . ondansetron (ZOFRAN  ODT) 4 MG disintegrating tablet Take 1 tablet (4 mg total) by mouth every 6 (six) hours as needed for nausea. (Patient not taking: Reported on 05/21/2019) 60 tablet 1  . Prenatal Vit-Fe Fumarate-FA (PRENATAL VITAMINS PO) Take by mouth.    . promethazine (PHENERGAN) 25 MG tablet Take 1 tablet (25 mg total) by mouth every 6 (six) hours as needed for nausea or vomiting. (Patient not taking: Reported on 03/20/2019) 30 tablet 1     ROS Complete ROS completed and otherwise negative except as noted in HPI  Physical Exam   LMP 10/16/2018   Physical Exam  Vitals reviewed. Constitutional: She appears well-developed and well-nourished. No distress.  Eyes: No scleral icterus.  Cardiovascular: Normal rate, regular rhythm and normal heart sounds.  No murmur heard. Respiratory: Effort normal and breath sounds normal. No respiratory distress. She has no wheezes. She has no rales.  GI: Soft. Bowel sounds are normal. She exhibits no distension. There is no abdominal tenderness. There is no rebound and no guarding.  Musculoskeletal:        General: Edema present.     Comments: 2+ LE edema  Neurological: She is alert. Coordination normal.  Skin: Skin is warm and dry. She is not diaphoretic.  Psychiatric: She has a normal mood and affect.    Bedside Ultrasound Twin A cephalic Twin B oblique with head to maternal left  FHT Discontinuous but Cat I, multiple accels  MAU Course  Procedures NST CBC, CMP, Urine P:C Betamethasone Ferraheme  MDM Moderate  Assessment and Plan  #gHTN #LE edema #Di/di twin pregnancy Patient presenting with LE edema but otherwise asymptomatic and two mild range BP's exactly four hours apart. CBC with anemia but platelets, AST/ALT, and urine P:C normal. Presentation c/w gHTN. FHT reassuring with reactive NST for both twins. Given first dose of betamethasone with plan to get second dose in clinic tomorrow, message sent to [facility] clinical pool. Will also need increased  antenatal testing. Reviewed diagnosis in detail with patient including warning signs of PreE, need to cont checking BP regularly, patient also aware needs second dose of beta tomorrow as well as increased testing.   #Anemia Progressive drop over course of pregnancy, given dose of Feraheme while here. Instructed to stop PO iron supplementation.   Discussed with Dr. Harolyn Rutherford and Dr. Ethlyn Gallery Lamount Cohen

## 2019-06-05 ENCOUNTER — Ambulatory Visit (INDEPENDENT_AMBULATORY_CARE_PROVIDER_SITE_OTHER): Payer: BC Managed Care – PPO | Admitting: General Practice

## 2019-06-05 VITALS — BP 134/77 | HR 105 | Ht 63.0 in | Wt 177.0 lb

## 2019-06-05 DIAGNOSIS — Z3A33 33 weeks gestation of pregnancy: Secondary | ICD-10-CM | POA: Diagnosis not present

## 2019-06-05 DIAGNOSIS — O133 Gestational [pregnancy-induced] hypertension without significant proteinuria, third trimester: Secondary | ICD-10-CM

## 2019-06-05 MED ORDER — BETAMETHASONE SOD PHOS & ACET 6 (3-3) MG/ML IJ SUSP
12.0000 mg | Freq: Once | INTRAMUSCULAR | Status: AC
  Administered 2019-06-05: 12 mg via INTRAMUSCULAR

## 2019-06-05 NOTE — Progress Notes (Signed)
[name] here for Betamethasone  Injection.  Injection administered without complication. Patient will return next week for OB visit.  Derinda Late, RN 06/05/2019  4:44 PM

## 2019-06-05 NOTE — Progress Notes (Signed)
Patient seen and assessed by nursing staff during this encounter. I have reviewed the chart and agree with the documentation and plan.  Mora Bellman, MD 06/05/2019 4:52 PM

## 2019-06-10 ENCOUNTER — Ambulatory Visit (INDEPENDENT_AMBULATORY_CARE_PROVIDER_SITE_OTHER): Payer: BC Managed Care – PPO | Admitting: Family Medicine

## 2019-06-10 ENCOUNTER — Other Ambulatory Visit: Payer: Self-pay | Admitting: Family Medicine

## 2019-06-10 ENCOUNTER — Ambulatory Visit (HOSPITAL_COMMUNITY): Payer: BC Managed Care – PPO | Admitting: *Deleted

## 2019-06-10 ENCOUNTER — Other Ambulatory Visit: Payer: Self-pay

## 2019-06-10 ENCOUNTER — Encounter (HOSPITAL_COMMUNITY): Payer: Self-pay | Admitting: *Deleted

## 2019-06-10 ENCOUNTER — Ambulatory Visit (HOSPITAL_COMMUNITY)
Admission: RE | Admit: 2019-06-10 | Discharge: 2019-06-10 | Disposition: A | Discharge status: Home / Self Care | Payer: BC Managed Care – PPO | Source: Ambulatory Visit | Attending: Obstetrics and Gynecology | Admitting: Obstetrics and Gynecology

## 2019-06-10 ENCOUNTER — Other Ambulatory Visit (HOSPITAL_COMMUNITY): Payer: Self-pay | Admitting: *Deleted

## 2019-06-10 VITALS — BP 152/94 | HR 80 | Wt 176.9 lb

## 2019-06-10 DIAGNOSIS — O1203 Gestational edema, third trimester: Secondary | ICD-10-CM

## 2019-06-10 DIAGNOSIS — O99013 Anemia complicating pregnancy, third trimester: Secondary | ICD-10-CM

## 2019-06-10 DIAGNOSIS — O0990 Supervision of high risk pregnancy, unspecified, unspecified trimester: Secondary | ICD-10-CM

## 2019-06-10 DIAGNOSIS — O0993 Supervision of high risk pregnancy, unspecified, third trimester: Secondary | ICD-10-CM | POA: Diagnosis not present

## 2019-06-10 DIAGNOSIS — O133 Gestational [pregnancy-induced] hypertension without significant proteinuria, third trimester: Secondary | ICD-10-CM

## 2019-06-10 DIAGNOSIS — O30043 Twin pregnancy, dichorionic/diamniotic, third trimester: Secondary | ICD-10-CM

## 2019-06-10 DIAGNOSIS — D508 Other iron deficiency anemias: Secondary | ICD-10-CM

## 2019-06-10 DIAGNOSIS — O30049 Twin pregnancy, dichorionic/diamniotic, unspecified trimester: Secondary | ICD-10-CM

## 2019-06-10 DIAGNOSIS — Z3A33 33 weeks gestation of pregnancy: Secondary | ICD-10-CM

## 2019-06-10 DIAGNOSIS — O30042 Twin pregnancy, dichorionic/diamniotic, second trimester: Secondary | ICD-10-CM

## 2019-06-10 MED ORDER — KNEE COMPRESSION SLEEVE/L/XL MISC: 1.0000 | Freq: Every day | 0 refills | Status: AC

## 2019-06-10 NOTE — Progress Notes (Signed)
Patient ID: Allison Fritz, female   DOB: [birth year], 28 y.o.   MRN: [id number] Subjective:  Allison Fritz is a 28 y.o. G3P1011 at 54w6dbeing seen today for ongoing prenatal care.  She is currently monitored for the following issues for this high-risk pregnancy and has Left shoulder pain; Supervision of high risk pregnancy, antepartum; Dichorionic diamniotic twin pregnancy, antepartum; Gestational hypertension; and Anemia on their problem list.  Patient reports contractions since  a few days, intermittend and irregular.  Contractions: Irritability. Vag. Bleeding: None.  Movement: Present. Denies leaking of fluid. Complains of swelling, but denies headaches or vision changes.  The following portions of the patient's history were reviewed and updated as appropriate: allergies, current medications, past family history, past medical history, past social history, past surgical history and problem list. Problem list updated.   Objective:   Vitals:   06/10/19 1137 06/10/19 1140  BP: (!) 160/93 (!) 152/94  Pulse: 84 80  Weight: 176 lb 14.4 oz (80.2 kg)     Fetal Status:     Movement: Present     General:  Alert, oriented and cooperative. Patient is in no acute distress.  Skin: Skin is warm and dry. No rash noted.   Cardiovascular: Normal heart rate noted  Respiratory: Normal respiratory effort, no problems with respiration noted  Abdomen: Soft, gravid, appropriate for gestational age. Pain/Pressure: Present     Pelvic: Vag. Bleeding: None     Cervical exam deferred        Extremities: Normal range of motion.  Edema: Deep pitting, indentation remains for a short time  Mental Status: Normal mood and affect. Normal behavior. Normal judgment and thought content.   Urinalysis:      Assessment and Plan:  Pregnancy: G3P1011 at 281w6d1. Supervision of high risk pregnancy, antepartum  2. Gestational hypertension, third trimester S/p BMZ - CBC - Comp Met (CMET) - Protein / creatinine  ratio, urine - weekly labs/BPP - Delivery at 37 weeks if mild  3. Dichorionic diamniotic twin pregnancy, antepartum -Concordance 6%  4. Iron deficiency anemia secondary to inadequate dietary iron intake S/p 1 feraheme infusion, 2nd one scheduled  5. Edema during pregnancy in third trimester - counselled on leg elevation - Elastic Bandages & Supports (KNEE COMPRESSION SLEEVE/L/XL) MISC; 1 Device by Does not apply route daily.  Dispense: 1 each; Refill: 0  Preterm labor symptoms and general obstetric precautions including but not limited to vaginal bleeding, contractions, leaking of fluid and fetal movement were reviewed in detail with the patient. Please refer to After Visit Summary for other counseling recommendations.  Return [facility] about 1 [facility] (around 06[facility]17/2019[facility]  Aboubacar Matsuo L, DO

## 2019-06-10 NOTE — Progress Notes (Signed)
LM for Short Stay that I needed to schedule an appt for a Fereheme over the next day or two due to pt having a Fereheme about a week ago.   Received call from [place], [place] Stay, that pt's appt has been scheduled for August 20th @ 1000.  Pt notified.

## 2019-06-10 NOTE — Patient Instructions (Addendum)
Hypertension During Pregnancy Hypertension is also called high blood pressure. High blood pressure means that the force of your blood moving in your body is too strong. It can cause problems for you and your baby. Different types of high blood pressure can happen during pregnancy. The types are:  High blood pressure before you got pregnant. This is called chronic hypertension.  This can continue during your pregnancy. Your doctor will want to keep checking your blood pressure. You may need medicine to keep your blood pressure under control while you are pregnant. You will need follow-up visits after you have your baby.  High blood pressure that goes up during pregnancy when it was normal before. This is called gestational hypertension. It will usually get better after you have your baby, but your doctor will need to watch your blood pressure to make sure that it is getting better.  Very high blood pressure during pregnancy. This is called preeclampsia. Very high blood pressure is an emergency that needs to be checked and treated right away.  You may develop very high blood pressure after giving birth. This is called postpartum preeclampsia. This usually occurs within 48 hours after childbirth but may occur up to 6 weeks after giving birth. This is rare. How does this affect me? If you have high blood pressure during pregnancy, you have a higher chance of developing high blood pressure:  As you get older.  If you get pregnant again. In some cases, high blood pressure during pregnancy can cause:  Stroke.  Heart attack.  Damage to the kidneys, lungs, or liver.  Preeclampsia.  Jerky movements you cannot control (convulsions or seizures).  Problems with the placenta. How does this affect my baby? Your baby may:  Be born early.  Not weigh as much as he or she should.  Not handle labor well, leading to a c-section birth. What are the risks?  Having high blood pressure during a past  pregnancy.  Being overweight.  Being 35 years old or older.  Being pregnant for the first time.  Being pregnant with more than one baby.  Becoming pregnant using fertility methods, such as IVF.  Having other problems, such as diabetes, or kidney disease.  Having family members who have high blood pressure. What can I do to lower my risk?   Keep a healthy weight.  Eat a healthy diet.  Follow what your doctor tells you about treating any medical problems that you had before becoming pregnant. It is very important to go to all of your doctor visits. Your doctor will check your blood pressure and make sure that your pregnancy is progressing as it should. Treatment should start early if a problem is found. How is this treated? Treatment for high blood pressure during pregnancy can differ depending on the type of high blood pressure you have and how serious it is.  You may need to take blood pressure medicine.  If you have been taking medicine for your blood pressure, you may need to change the medicine during pregnancy if it is not safe for your baby.  If your doctor thinks that you could get very high blood pressure, he or she may tell you to take a low-dose aspirin during your pregnancy.  If you have very high blood pressure, you may need to stay in the hospital so you and your baby can be watched closely. You may also need to take medicine to lower your blood pressure. This medicine may be given by mouth   or through an IV tube.  In some cases, if your condition gets worse, you may need to have your baby early. Follow these instructions at home: Eating and drinking   Drink enough fluid to keep your pee (urine) pale yellow.  Avoid caffeine. Lifestyle  Do not use any products that contain nicotine or tobacco, such as cigarettes, e-cigarettes, and chewing tobacco. If you need help quitting, ask your doctor.  Do not use alcohol or drugs.  Avoid stress.  Rest and get plenty  of sleep.  Regular exercise can help. Ask your doctor what kinds of exercise are best for you. General instructions  Take over-the-counter and prescription medicines only as told by your doctor.  Keep all prenatal and follow-up visits as told by your doctor. This is important. Contact a doctor if:  You have symptoms that your doctor told you to watch for, such as: ? Headaches. ? Nausea. ? Vomiting. ? Belly (abdominal) pain. ? Dizziness. ? Light-headedness. Get help right away if:  You have: ? Very bad belly pain that does not get better with treatment. ? A very bad headache that does not get better. ? Vomiting that does not get better. ? Sudden, fast weight gain. ? Sudden swelling in your hands, ankles, or face. ? Bleeding from your vagina. ? Blood in your pee. ? Blurry vision. ? Double vision. ? Shortness of breath. ? Chest pain. ? Weakness on one side of your body. ? Trouble talking.  Your baby is not moving as much as usual. Summary  High blood pressure is also called hypertension.  High blood pressure means that the force of your blood moving in your body is too strong.  High blood pressure can cause problems for you and your baby.  Keep all follow-up visits as told by your doctor. This is important. This information is not intended to replace advice given to you by your health care provider. Make sure you discuss any questions you have with your health care provider. Document Released: 11/11/2010 Document Revised: 01/30/2019 Document Reviewed: 11/05/2018 Elsevier Patient Education  2020 [employer] Breastfeeding and Self-Care It is normal to have some problems when you start to breastfeed your new baby. But there are things that you can do to take care of yourself and help prevent many common problems. This includes keeping your breasts healthy and making sure that your baby's mouth attaches (latches) properly to your nipple for feedings. Work with your doctor  or breastfeeding specialist (Advertising copywriterlactation consultant) to find what works best for you. Follow these instructions at home: Breastfeeding strategy   Always make sure that your baby latches properly to breastfeed.  Make sure that your baby is in a proper position. Try different breastfeeding positions to find one that works best for you and your baby.  Breastfeed when you feel like you need to make your breasts less full or when your baby shows signs of hunger. This is called "breastfeeding on demand."  Do not delay feedings.  Try to relax when it is time to feed your baby. This helps your body release milk from your breast.  To help increase milk flow: ? Remove a small amount of milk from your breast right before breastfeeding. Do this using a pump or by squeezing with your hand. ? Apply warm, moist heat to your breast right before feeding. You can do this in the shower or with hand towels soaked with warm water. ? Massage your breast right before or during feeding. Breast care  To help your breasts stay healthy and keep them from getting too dry: ? Avoid using soap on your nipples. ? Let your nipples air-dry for 3-4 minutes after each feeding. ? Use only cotton bra pads to soak up breast milk that leaks. Be sure to change the pads if they become soaked with milk. If you use bra pads that can be thrown away, change them often. ? Put some lanolin on your nipples after breastfeeding. Pure lanolin does not need to be washed off your nipple before you feed your baby again. Pure lanolin is not harmful to your baby. ? Rub some breast milk into your nipples: ? Use your hand to squeeze out a few drops of breast milk. ? Gently massage the milk into your nipples. ? Let your nipples air-dry.  Wear a supportive nursing bra. Avoid wearing: ? Tight clothing. ? Underwire bras or bras that put pressure on your breasts.  Use ice to help relieve pain or swelling of your breasts: ? Put ice in a plastic  bag. ? Place a towel between your skin and the bag. ? Leave the ice on for 20 minutes, 2-3 times a day. General instructions  Drink enough fluid to keep your pee (urine) pale yellow.  Get plenty of rest. Sleep when your baby sleeps.  Talk to your doctor or breastfeeding specialist before taking any herbal supplements. Contact a health care provider if:  You have nipple pain.  You have cracking or soreness in your nipples that lasts longer than 1 week.  Your breasts are overfilled with milk (engorgement) and this lasts longer than 48 hours.  You have a fever.  You have pus-like fluid coming from your nipple.  You have redness, a rash, swelling, itching, or burning on your breast.  Your baby does not gain weight.  Your baby loses weight. Summary  There are things that you can do to take care of yourself and help prevent many common breastfeeding problems.  Always make sure that your baby's mouth attaches (latches) to your nipple properly to breastfeed.  Keep your nipples from getting too dry, drink plenty of fluid, and get plenty of rest.  Feed on demand. Do not delay feedings. This information is not intended to replace advice given to you by your health care provider. Make sure you discuss any questions you have with your health care provider. Document Released: 05/16/2017 Document Revised: 01/29/2019 Document Reviewed: 05/16/2017 Elsevier Patient Education  2020 [employer].

## 2019-06-11 ENCOUNTER — Telehealth: Payer: Self-pay

## 2019-06-11 LAB — COMPREHENSIVE METABOLIC PANEL
ALT: 70 IU/L — ABNORMAL HIGH (ref 0–32)
AST: 86 IU/L — ABNORMAL HIGH (ref 0–40)
Albumin/Globulin Ratio: 1.3 (ref 1.2–2.2)
Albumin: 3.3 g/dL — ABNORMAL LOW (ref 3.9–5.0)
Alkaline Phosphatase: 199 IU/L — ABNORMAL HIGH (ref 39–117)
BUN/Creatinine Ratio: 4 — ABNORMAL LOW (ref 9–23)
BUN: 3 mg/dL — ABNORMAL LOW (ref 6–20)
Bilirubin Total: 0.4 mg/dL (ref 0.0–1.2)
CO2: 17 mmol/L — ABNORMAL LOW (ref 20–29)
Calcium: 9.3 mg/dL (ref 8.7–10.2)
Chloride: 105 mmol/L (ref 96–106)
Creatinine, Ser: 0.77 mg/dL (ref 0.57–1.00)
GFR calc Af Amer: 122 mL/min/{1.73_m2} (ref >59)
GFR calc non Af Amer: 105 mL/min/{1.73_m2} (ref >59)
Globulin, Total: 2.6 g/dL (ref 1.5–4.5)
Glucose: 105 mg/dL — ABNORMAL HIGH (ref 65–99)
Potassium: 3.3 mmol/L — ABNORMAL LOW (ref 3.5–5.2)
Sodium: 139 mmol/L (ref 134–144)
Total Protein: 5.9 g/dL — ABNORMAL LOW (ref 6.0–8.5)

## 2019-06-11 LAB — CBC
Hematocrit: 23.4 % — ABNORMAL LOW (ref 34.0–46.6)
Hemoglobin: 7.5 g/dL — ABNORMAL LOW (ref 11.1–15.9)
MCH: 26.5 pg — ABNORMAL LOW (ref 26.6–33.0)
MCHC: 32.1 g/dL (ref 31.5–35.7)
MCV: 83 fL (ref 79–97)
NRBC: 6 % — ABNORMAL HIGH (ref 0–0)
Platelets: 169 10*3/uL (ref 150–450)
RBC: 2.83 x10E6/uL — ABNORMAL LOW (ref 3.77–5.28)
RDW: 16 % — ABNORMAL HIGH (ref 11.7–15.4)
WBC: 7.5 10*3/uL (ref 3.4–10.8)

## 2019-06-11 LAB — PROTEIN / CREATININE RATIO, URINE
Creatinine, Urine: 122.4 mg/dL
Protein, Ur: 46.1 mg/dL
Protein/Creat Ratio: 377 mg/g creat — ABNORMAL HIGH (ref 0–200)

## 2019-06-11 NOTE — Telephone Encounter (Signed)
Patient called and stated she was seen in the office yesterday and is supposed to come in the office tomorrow for a bp check. She has di-di twins and states her swelling in her hands and feet are still the same.  Patient states she started having headaches last night and it is persistent, she is seeing white spots and cant seem to do anything to feel better. Had patient sit for 5 mins and take her blood pressure it was 142/89 her pulse was 78.   Per Dr.Pratt patient needs to go to MAU immediately to be accessed.

## 2019-06-12 ENCOUNTER — Telehealth: Payer: Self-pay | Admitting: Family Medicine

## 2019-06-12 ENCOUNTER — Inpatient Hospital Stay (HOSPITAL_COMMUNITY)
Admission: RE | Admit: 2019-06-12 | Discharge: 2019-06-12 | Disposition: A | Discharge status: Home / Self Care | Payer: BC Managed Care – PPO | Source: Ambulatory Visit | Attending: Family Medicine | Admitting: Family Medicine

## 2019-06-12 ENCOUNTER — Ambulatory Visit: Payer: BC Managed Care – PPO

## 2019-06-12 NOTE — Telephone Encounter (Signed)
Spoke with patient this AM while she was at work, regarding her labwork results, diagnosis of preeclampsia with severe features, and the need to move towards delivery. Patient's headache has subsided and she feels fine today. Due to the NICU census is full here at [facility] and at [facility], she is unable to be directly admitted for induction here, therefore she was directed to go to [facility] for evaluation/management of induction of labor. Answered her questions to her satisfaction, and she is heading there now.  Called and spoke with [place] [facility] Fellow, Dr. Malachy Moan, who accepted transfer of care for patient. Patient will be travelling by personal vehicle with family to [facility].    Katherine Basset, [place] for [employer] 06/12/2019  10:14 AM

## 2019-06-12 NOTE — Telephone Encounter (Signed)
Spoke to patient about her labs. She said she went to MAU yesterday, but was told she was too early to be delivered so she left. I stressed the importance of going back to MAU because her labs indicated that she has pre-eclampsia. She will go to MAU. I have let the MAU provider know.

## 2019-06-13 ENCOUNTER — Other Ambulatory Visit: Payer: Self-pay

## 2019-06-13 ENCOUNTER — Ambulatory Visit (INDEPENDENT_AMBULATORY_CARE_PROVIDER_SITE_OTHER): Payer: BC Managed Care – PPO

## 2019-06-13 VITALS — BP 152/98

## 2019-06-13 DIAGNOSIS — Z0130 Encounter for examination of blood pressure without abnormal findings: Secondary | ICD-10-CM

## 2019-06-13 NOTE — Progress Notes (Signed)
Reviewed patients chart from recent visits with [facility] and [facility]. Patient requires admission for pre-eclampsia. Consulted with Dr. Ilda Basset who agrees with plan of care. Patient directed to go straight to MAU for monitoring until we can secure a site with a NICU that is appropriate for the pre-term delivery.   Luvenia Redden, PA-C 06/13/2019 3:58 PM

## 2019-06-13 NOTE — Progress Notes (Signed)
Pt here today for BP check.  Pt reports that she was at [facility] yesterday in which they informed her that she did not have preeclampsia.  Pt stated that she went there because we did not have space in NICU so she was advised by our provider to go to [place] stated that she left from [facility] last night because she did not want to be in a room just for monitoring her blood pressure.  Pt stated that she wanted to be home if that is what they were going to.  Obtained pt's BP 160/92 RA manually, rpt BP LA 152/98 manually.  Pt denies any sx's of HTN.  Notified Kerry Hough, PA who recommended that pt go to MAU.  Almyra Free goes to speak with pt.    Mel Almond, RN

## 2019-06-17 ENCOUNTER — Ambulatory Visit (HOSPITAL_COMMUNITY): Payer: BC Managed Care – PPO

## 2019-06-17 ENCOUNTER — Encounter: Payer: BC Managed Care – PPO | Admitting: Advanced Practice Midwife

## 2019-06-17 ENCOUNTER — Encounter (HOSPITAL_COMMUNITY): Payer: Self-pay

## 2019-06-18 ENCOUNTER — Encounter: Payer: BC Managed Care – PPO | Admitting: Family Medicine

## 2019-06-25 ENCOUNTER — Encounter (HOSPITAL_COMMUNITY): Payer: Self-pay

## 2019-06-25 ENCOUNTER — Encounter: Payer: Self-pay | Admitting: *Deleted

## 2019-06-25 ENCOUNTER — Encounter: Payer: BC Managed Care – PPO | Admitting: Obstetrics and Gynecology

## 2019-06-25 ENCOUNTER — Ambulatory Visit (HOSPITAL_COMMUNITY): Payer: BC Managed Care – PPO

## 2019-07-01 ENCOUNTER — Telehealth: Payer: Self-pay | Admitting: Student

## 2019-07-01 ENCOUNTER — Ambulatory Visit: Payer: BC Managed Care – PPO

## 2019-07-01 NOTE — Telephone Encounter (Signed)
The patient stated she would like a call back due to the diagnosis she received from the delivery (precylmpsia). She also stated she will need an additional doctors note to state our of work longer.

## 2019-07-02 ENCOUNTER — Ambulatory Visit (HOSPITAL_COMMUNITY): Payer: BC Managed Care – PPO

## 2019-07-03 ENCOUNTER — Ambulatory Visit (INDEPENDENT_AMBULATORY_CARE_PROVIDER_SITE_OTHER): Payer: BC Managed Care – PPO | Admitting: General Practice

## 2019-07-03 ENCOUNTER — Other Ambulatory Visit: Payer: Self-pay

## 2019-07-03 VITALS — BP 124/82 | HR 94 | Ht 62.0 in | Wt 134.0 lb

## 2019-07-03 DIAGNOSIS — G8918 Other acute postprocedural pain: Secondary | ICD-10-CM

## 2019-07-03 MED ORDER — IBUPROFEN 800 MG PO TABS: 800.0000 mg | ORAL_TABLET | Freq: Three times a day (TID) | ORAL | 0 refills | Status: DC | PRN

## 2019-07-03 NOTE — Progress Notes (Signed)
Patient presents to office today for wound check following  primary c-section on 8/25 at [facility]. Pregnancy was significant for gestational hypertension and she was sent home on Procardia 30mg  BID. She denies headaches, dizziness or blurry vision. BP today 132/92 with manual repeat 124/82. Incision is clean, dry & intact with steri strips still present. I removed a couple steri strips but patient requests to do the rest herself. Wound care instructions reviewed. She requests refill on Ibuprofen Rx. Rx sent in per protocol. Patient will follow up at postpartum visit on 10/7.  Koren Bound RN BSN 07/03/19

## 2019-07-04 ENCOUNTER — Encounter: Payer: Self-pay | Admitting: *Deleted

## 2019-07-07 NOTE — Progress Notes (Signed)
I have reviewed this chart and agree with the RN/CMA assessment and management.    Cassandria Drew C Daunte Oestreich, MD, FACOG Attending Physician, Faculty Practice [facility]  

## 2019-07-08 ENCOUNTER — Encounter: Payer: Self-pay | Admitting: Family Medicine

## 2019-07-23 ENCOUNTER — Inpatient Hospital Stay (HOSPITAL_COMMUNITY): Admission: AD | Admit: 2019-07-23 | Payer: BC Managed Care – PPO | Source: Home / Self Care

## 2019-07-29 ENCOUNTER — Telehealth: Payer: Self-pay | Admitting: Family Medicine

## 2019-07-29 NOTE — Telephone Encounter (Signed)
The patient called in stating she needs an earlier appointment and if not she will have to reschedule. Offered the patient an mychart appointment as she did not want to leave her children. The patient stated the mychart visit would be better for her. Informed someone from our office will give her call prior to the appointment time.

## 2019-07-30 ENCOUNTER — Telehealth (INDEPENDENT_AMBULATORY_CARE_PROVIDER_SITE_OTHER): Payer: BC Managed Care – PPO | Admitting: Family Medicine

## 2019-07-30 ENCOUNTER — Other Ambulatory Visit: Payer: Self-pay

## 2019-07-30 ENCOUNTER — Encounter: Payer: Self-pay | Admitting: Family Medicine

## 2019-07-30 DIAGNOSIS — Z3041 Encounter for surveillance of contraceptive pills: Secondary | ICD-10-CM

## 2019-07-30 MED ORDER — NORGESTIMATE-ETH ESTRADIOL 0.25-35 MG-MCG PO TABS: 1.0000 | ORAL_TABLET | Freq: Every day | ORAL | 4 refills | Status: AC

## 2019-07-30 NOTE — Progress Notes (Signed)
I connected with@ on 07/30/19 at  9:15 AM EDT OX:[id number] and verified that I am speaking with the correct person using two identifiers.  Patient is located at [facility] and provider is located at [place].     The purpose of this virtual visit is to provide medical care while limiting exposure to the novel coronavirus. I discussed the limitations, risks, security and privacy concerns of performing an evaluation and management service and the availability of in person appointments. I also discussed with the patient that there may be a patient responsible charge related to this service. By engaging in this virtual visit, you consent to the provision of healthcare.  Additionally, you authorize for your insurance to be billed for the services provided during this visit.  The patient expressed understanding and agreed to proceed.  The following staff members participated in the virtual visit:  Lowanda Foster, [place], Darron Doom, MD  Post Partum Visit Note Subjective:    [name] is a 28 y.o. G67P1011 female who presents for a postpartum visit. She is 6 weeks postpartum following a Low transverse Ceseran. I have fully reviewed the prenatal and intrapartum course. The delivery was at  34.5 gestational weeks. Outcome: primary cesarean section, low transverse incision. Anesthesia: spinal. Postpartum course has been remarkable swelling and re-admission for pre-eclampsia. On Nifedipine and just went to once/day dosing. Baby's course has been unremarkable. Baby is feeding by bottle - Similac Neosure. Bleeding no bleeding. Bowel function is normal. Bladder function is normal. Patient is not sexually active. Contraception method is OCP (estrogen/progesterone). Postpartum depression screening: Negative I have independently reviewed the above information and verified it with the patient.  The following portions of the patient's history were reviewed and updated as appropriate: allergies, current medications, past family  history, past medical history, past social history, past surgical history and problem list.  Review of Systems Pertinent items noted in HPI and remainder of comprehensive ROS otherwise negative.   Objective:  There were no vitals filed for this visit. Self-Obtained BP was 120's/80s yesterday NAD Has baby with her Exam limited by the type of visit.       Assessment:    Normal postpartum exam. Pap smear not done at today's visit. Last pap smear 2019 and results were normal. Next pap due 2021.   Plan:    1. Contraception: OCP (estrogen/progesterone) and tubal ligation 2. Wants meds for cramping and cycle control 3. Follow up in: 3 months or as needed.  4. If BPs are in the 110/70 range, may stop her procardia. Will likely help with LE edema as well.  16 minutes of non-face-to-face time spent with the patient   Donnamae Jude, MD 07/30/2019 9:15 AM

## 2019-08-06 ENCOUNTER — Encounter: Payer: Self-pay | Admitting: Family Medicine

## 2019-12-01 ENCOUNTER — Ambulatory Visit: Payer: BC Managed Care – PPO | Admitting: Family Medicine

## 2019-12-12 IMAGING — US US MFM OB FOLLOW UP
1 series · 16 of 28 positions shown · non-contrast
Comparison: none

[Series 1: us mfm ob follow up · 86 acquisitions, 16 frames shown]
[im 1/86]
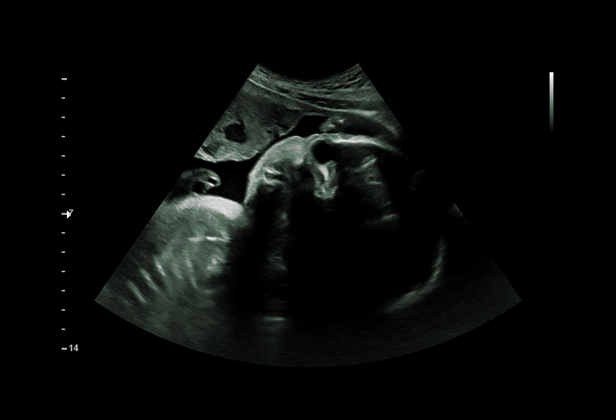
[im 7/86]
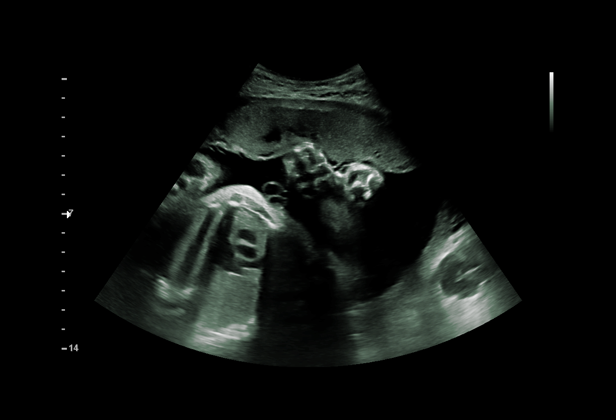
[im 13/86]
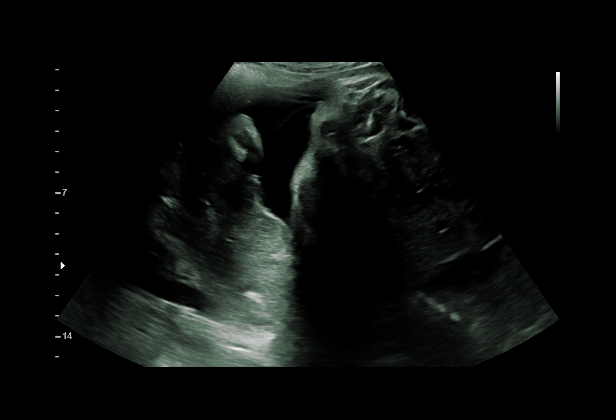
[im 19/86]
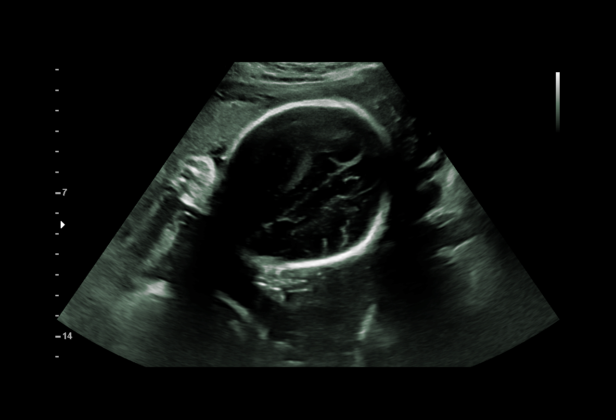
[im 23/86]
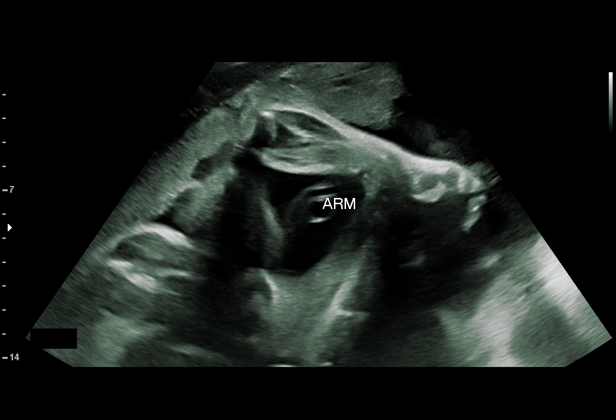
[im 29/86]
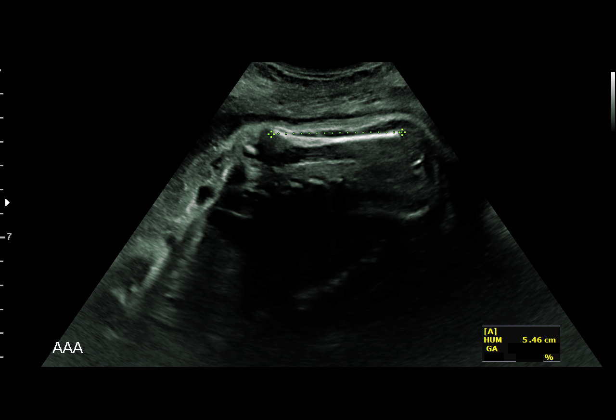
[im 35/86]
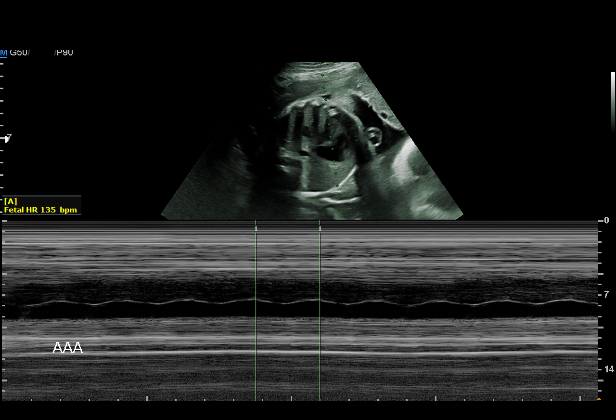
[im 41/86]
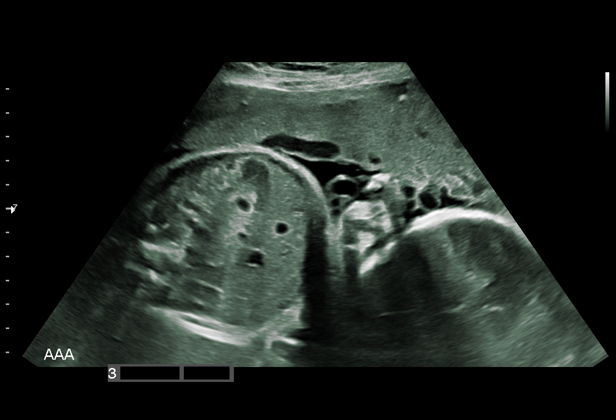
[im 45/86]
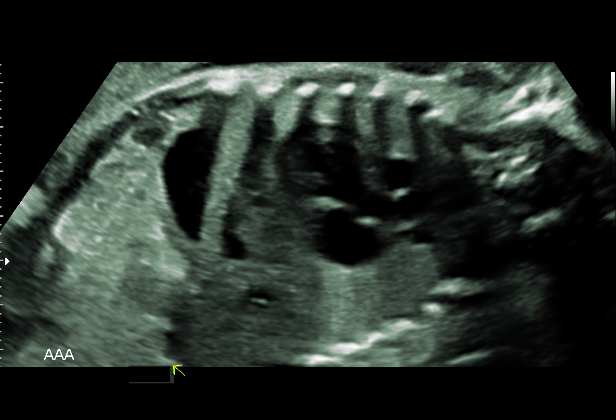
[im 51/86]
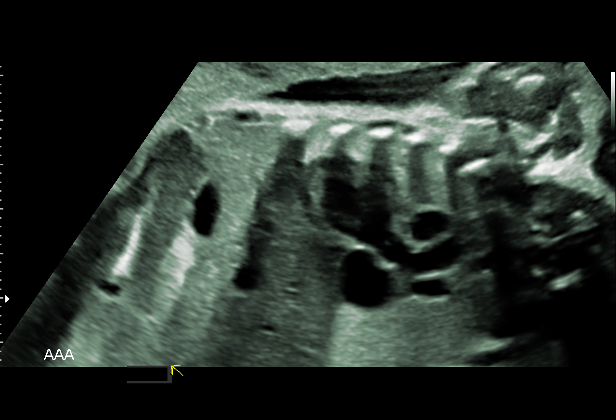
[im 57/86]
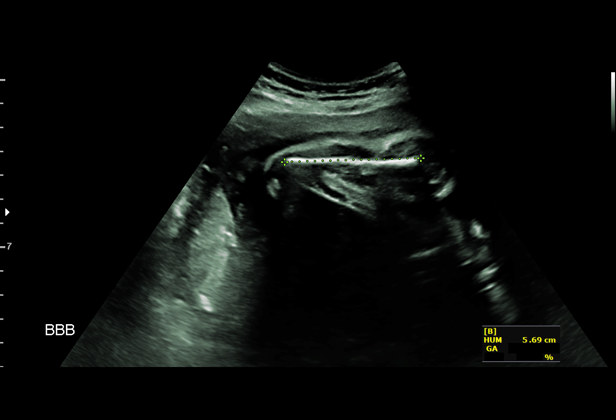
[im 63/86]
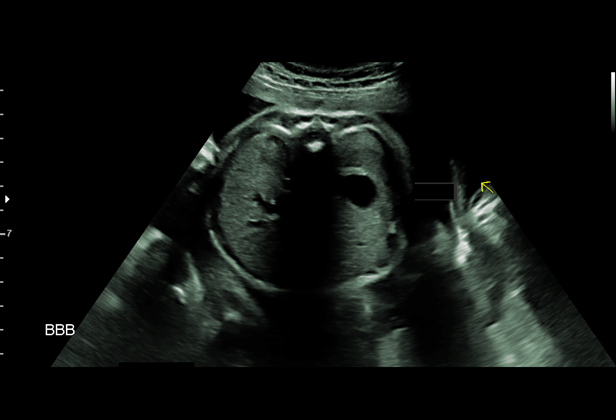
[im 67/86]
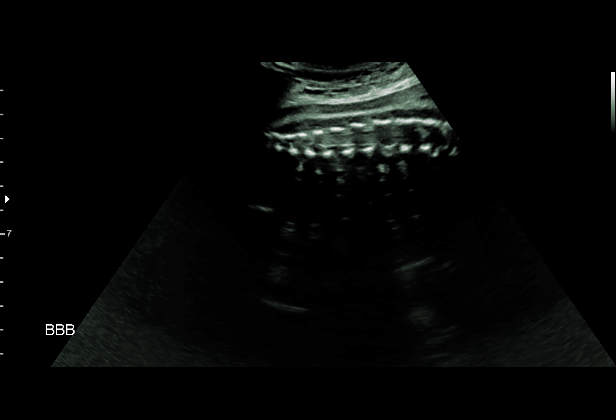
[im 73/86]
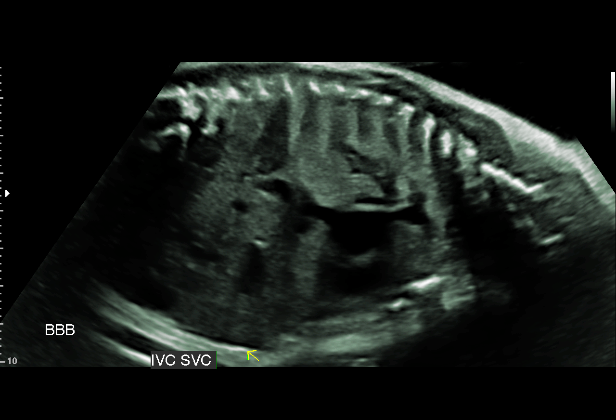
[im 79/86]
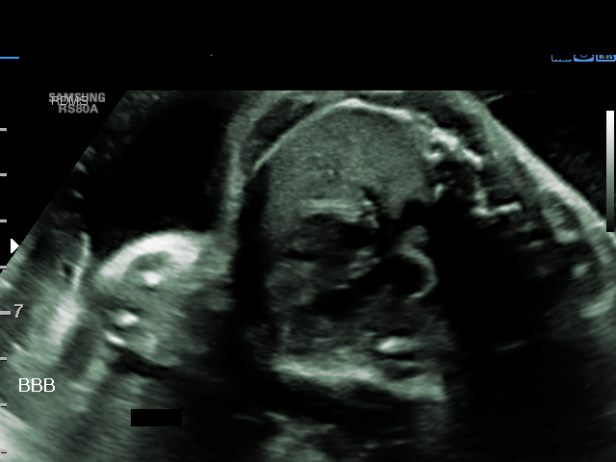
[im 86/86]
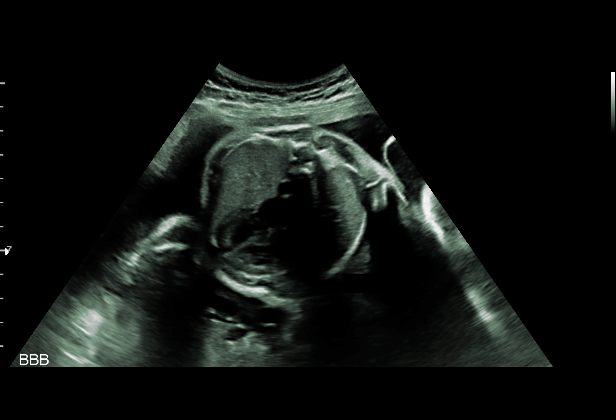

[16 of 28 positions shown; findings below may reference images not displayed]

BRANDON
     GALO EDGAR                                              BRANDON
 ----------------------------------------------------------------------

 ----------------------------------------------------------------------
Indications

  Twin pregnancy, di/di, second trimester
  31 weeks gestation of pregnancy
  Encounter for other antenatal screening
  follow-up
 ----------------------------------------------------------------------
Vital Signs

                                                Height:        5'2"
Fetal Evaluation (Fetus A)

 Num Of Fetuses:         2
 Fetal Heart Rate(bpm):  135
 Cardiac Activity:       Observed
 Fetal Lie:              Lower Fetus
 Presentation:           Cephalic
 Placenta:               Anterior
 P. Cord Insertion:      Visualized, central

 Amniotic Fluid
 AFI FV:      Within normal limits

                             Largest Pocket(cm)

Biometry (Fetus A)
 BPD:      79.3  mm     G. Age:  31w 6d         65  %    CI:        77.86   %    70 - 86
                                                         FL/HC:      21.5   %    19.3 -
 HC:      284.4  mm     G. Age:  31w 2d         20  %    HC/AC:      1.06        0.96 -
 AC:      268.1  mm     G. Age:  30w 6d         44  %    FL/BPD:     77.2   %    71 - 87
 FL:       61.2  mm     G. Age:  31w 5d         58  %    FL/AC:      22.8   %    20 - 24
 HUM:      54.4  mm     G. Age:  31w 4d         66  %

 Est. FW:    3989  gm    3 lb 13 oz      47  %     FW Discordancy         6  %
OB History

 Gravidity:    3         Term:   1         SAB:   1
 Living:       1
Gestational Age (Fetus A)

 LMP:           31w 0d        Date:  10/16/18                 EDD:   07/23/19
 U/S Today:     31w 3d                                        EDD:   07/20/19
 Best:          31w 0d     Det. By:  LMP  (10/16/18)          EDD:   07/23/19
Anatomy (Fetus A)

 Cranium:               Appears normal         LVOT:                   Appears normal
 Cavum:                 Previously seen        Aortic Arch:            Previously seen
 Ventricles:            Previously seen        Ductal Arch:            Previously seen
 Choroid Plexus:        Appears normal         Diaphragm:              Appears normal
 Cerebellum:            Appears normal         Stomach:                Appears normal, left
                                                                       sided
 Posterior Fossa:       Appears normal         Abdomen:                Appears normal
 Nuchal Fold:           Previously seen        Abdominal Wall:         Appears nml (cord
                                                                       insert, abd wall)
 Face:                  Appears normal         Cord Vessels:           Appears normal (3
                        (orbits and profile)                           vessel cord)
 Lips:                  Not well visualized    Kidneys:                Appear normal
 Palate:                Appears normal         Bladder:                Appears normal
 Thoracic:              Appears normal         Spine:                  Previously seen
 Heart:                 Appears normal         Upper Extremities:      Previously seen
                        (4CH, axis, and
                        situs)
 RVOT:                  Appears normal         Lower Extremities:      Previously seen

 Other:  Heels and 5th digit visualized. Nasal bone visualized. Technically
         difficult due to fetal position.

Fetal Evaluation (Fetus B)

 Num Of Fetuses:         2
 Fetal Heart Rate(bpm):  136
 Cardiac Activity:       Observed
 Fetal Lie:              Upper Fetus
 Presentation:           Oblique
 Placenta:               Posterior

 Amniotic Fluid
 AFI FV:      Within normal limits

                             Largest Pocket(cm)

Biometry (Fetus B)

 BPD:      79.6  mm     G. Age:  32w 0d         69  %    CI:        75.23   %    70 - 86
                                                         FL/HC:      21.6   %    19.3 -
 HC:      291.1  mm     G. Age:  32w 1d         43  %    HC/AC:      1.07        0.96 -
 AC:      273.1  mm     G. Age:  31w 3d         59  %    FL/BPD:     79.0   %    71 - 87
 FL:       62.9  mm     G. Age:  32w 4d         78  %    FL/AC:      23.0   %    20 - 24
 HUM:      56.4  mm     G. Age:  32w 6d         88  %
 CER:      44.4  mm     G. Age:  38w 3d       > 95  %

 Est. FW:    8908  gm      4 lb 1 oz     68  %     FW Discordancy      0 \ 6 %
Gestational Age (Fetus B)

 LMP:           31w 0d        Date:  10/16/18                 EDD:   07/23/19
 U/S Today:     32w 0d                                        EDD:   07/16/19
 Best:          31w 0d     Det. By:  LMP  (10/16/18)          EDD:   07/23/19
Anatomy (Fetus B)

 Cranium:               Appears normal         Aortic Arch:            Appears normal
 Cavum:                 Previously seen        Ductal Arch:            Previously seen
 Ventricles:            Previously seen        Diaphragm:              Appears normal
 Choroid Plexus:        Appears normal         Stomach:                Appears normal, left
                                                                       sided
 Cerebellum:            Appears normal         Abdomen:                Appears normal
 Posterior Fossa:       Appears normal         Abdominal Wall:         Previously seen
 Nuchal Fold:           Previously seen        Cord Vessels:           Appears normal (3
                                                                       vessel cord)
 Face:                  Orbits and profile     Kidneys:                Appear normal
                        previously seen
 Lips:                  Previously seen        Bladder:                Appears normal
 Thoracic:              Appears normal         Spine:                  Previously seen
 Heart:                 Appears normal         Upper Extremities:      Previously seen
                        (4CH, axis, and
                        situs)
 RVOT:                  Appears normal         Lower Extremities:      Previously seen
 LVOT:                  Appears normal

 Other:  Nasal bone prev. visualized. Heels visualized. Technically difficult due
         to fetal position.
Cervix Uterus Adnexa

 Cervix
 Not visualized (advanced GA >35wks)
Impression

 Dichorionic-diamniotic twin pregnancy.
 Patient does not have gestational diabetes. BP at our office:
 123/81 mm Hg.
 Twin A: Lower fetus, cephalic presentation, anterior placenta.
 Amniotic fluid is normal and good fetal activity is seen. Fetal
 growth is appropriate for gestational age.
 Twin B: Upper fetus, oblique lie, posterior placenta. Amniotic
 fluid is normal and good fetal activity is seen. Fetal growth is
 appropriate for gestational age.
 Growth discordancy: 6% (normal).
Recommendations

 -An appointment was made for her to return in 5 weeks for
 fetal growth and BPP.
                 Gillman, Ladarrius

## 2020-01-14 ENCOUNTER — Ambulatory Visit: Payer: BC Managed Care – PPO | Admitting: Family Medicine

## 2020-01-14 ENCOUNTER — Encounter: Payer: Self-pay | Admitting: Family Medicine

## 2020-01-14 NOTE — Progress Notes (Signed)
Patient did not keep appointment today. She may call to reschedule.  

## 2020-02-23 ENCOUNTER — Other Ambulatory Visit: Payer: Self-pay | Admitting: *Deleted

## 2020-02-23 DIAGNOSIS — G8918 Other acute postprocedural pain: Secondary | ICD-10-CM

## 2020-02-23 MED ORDER — IBUPROFEN 800 MG PO TABS: 800.0000 mg | ORAL_TABLET | Freq: Three times a day (TID) | ORAL | 0 refills | Status: AC | PRN

## 2020-06-23 ENCOUNTER — Ambulatory Visit (INDEPENDENT_AMBULATORY_CARE_PROVIDER_SITE_OTHER): Payer: BC Managed Care – PPO | Admitting: Lactation Services

## 2020-06-23 ENCOUNTER — Encounter: Payer: Self-pay | Admitting: Lactation Services

## 2020-06-23 ENCOUNTER — Other Ambulatory Visit: Payer: Self-pay

## 2020-06-23 VITALS — BP 142/95 | Ht 61.0 in | Wt 152.9 lb

## 2020-06-23 DIAGNOSIS — R350 Frequency of micturition: Secondary | ICD-10-CM | POA: Diagnosis not present

## 2020-06-23 NOTE — Progress Notes (Signed)
Patient reports increased frequency and urgency with urination yesterday. She reports she started her Cycle today. UA negative except for trace blood which may be present due to starting cycle. She reports some back pain yesterday and today, could also be from cycle.   Pt with elevated BP 142/95. She is taking BP medication Hydrodiuril 12.5 mg daily and being followed by Dr. Tish Men @ [facility] in [place] and has an appointment in September for follow up. She denies HA, blurred vision or dizziness. Asked her to call and follow up with PCP.   Reviewed with Dr. Germaine Pomfret. Will not treat for UTI. Patient informed to drink plenty of fluids and to take Tylenol or Ibuprofen as needed for pain and discomfort. Patient voiced understanding.

## 2020-08-09 ENCOUNTER — Ambulatory Visit: Payer: BC Managed Care – PPO | Admitting: Family Medicine

## 2020-08-09 ENCOUNTER — Encounter: Payer: Self-pay | Admitting: Family Medicine

## 2020-08-10 NOTE — Progress Notes (Signed)
Patient did not keep appointment today. She may call to reschedule.  

## 2020-08-28 ENCOUNTER — Telehealth: Payer: BC Managed Care – PPO | Admitting: Nurse Practitioner

## 2020-08-28 DIAGNOSIS — J069 Acute upper respiratory infection, unspecified: Secondary | ICD-10-CM | POA: Diagnosis not present

## 2020-08-28 MED ORDER — PREDNISONE 10 MG (21) PO TBPK: ORAL_TABLET | ORAL | 0 refills | Status: AC

## 2020-08-28 NOTE — Progress Notes (Signed)
We are sorry that you are not feeling well.  Here is how we plan to help!  Based on your presentation I believe you most likely have A cough due to a virus.  This is called viral bronchitis and is best treated by rest, plenty of fluids and control of the cough.  You may use Ibuprofen or Tylenol as directed to help your symptoms.     In addition you may use A prescription cough medication called Tessalon Perles 100mg. You may take 1-2 capsules every 8 hours as needed for your cough. Can not do hycodan in an evisit  Prednisone 10 mg daily for 6 days (see taper instructions below)  Directions for 6 day taper: Day 1: 2 tablets before breakfast, 1 after both lunch & dinner and 2 at bedtime Day 2: 1 tab before breakfast, 1 after both lunch & dinner and 2 at bedtime Day 3: 1 tab at each meal & 1 at bedtime Day 4: 1 tab at breakfast, 1 at lunch, 1 at bedtime Day 5: 1 tab at breakfast & 1 tab at bedtime Day 6: 1 tab at breakfast   From your responses in the eVisit questionnaire you describe inflammation in the upper respiratory tract which is causing a significant cough.  This is commonly called Bronchitis and has four common causes:    Allergies  Viral Infections  Acid Reflux  Bacterial Infection Allergies, viruses and acid reflux are treated by controlling symptoms or eliminating the cause. An example might be a cough caused by taking certain blood pressure medications. You stop the cough by changing the medication. Another example might be a cough caused by acid reflux. Controlling the reflux helps control the cough.  USE OF BRONCHODILATOR ("RESCUE") INHALERS: There is a risk from using your bronchodilator too frequently.  The risk is that over-reliance on a medication which only relaxes the muscles surrounding the breathing tubes can reduce the effectiveness of medications prescribed to reduce swelling and congestion of the tubes themselves.  Although you feel brief relief from the  bronchodilator inhaler, your asthma may actually be worsening with the tubes becoming more swollen and filled with mucus.  This can delay other crucial treatments, such as oral steroid medications. If you need to use a bronchodilator inhaler daily, several times per day, you should discuss this with your provider.  There are probably better treatments that could be used to keep your asthma under control.     HOME CARE . Only take medications as instructed by your medical team. . Complete the entire course of an antibiotic. . Drink plenty of fluids and get plenty of rest. . Avoid close contacts especially the very young and the elderly . Cover your mouth if you cough or cough into your sleeve. . Always remember to wash your hands . A steam or ultrasonic humidifier can help congestion.   GET HELP RIGHT AWAY IF: . You develop worsening fever. . You become short of breath . You cough up blood. . Your symptoms persist after you have completed your treatment plan MAKE SURE YOU   Understand these instructions.  Will watch your condition.  Will get help right away if you are not doing well or get worse.  Your e-visit answers were reviewed by a board certified advanced clinical practitioner to complete your personal care plan.  Depending on the condition, your plan could have included both over the counter or prescription medications. If there is a problem please reply  once you have   received a response from your provider. Your safety is important to us.  If you have drug allergies check your prescription carefully.    You can use MyChart to ask questions about today's visit, request a non-urgent call back, or ask for a work or school excuse for 24 hours related to this e-Visit. If it has been greater than 24 hours you will need to follow up with your provider, or enter a new e-Visit to address those concerns. You will get an e-mail in the next two days asking about your experience.  I hope that  your e-visit has been valuable and will speed your recovery. Thank you for using e-visits.  5-10 minutes spent reviewing and documenting in chart.   

## 2021-05-06 ENCOUNTER — Other Ambulatory Visit: Payer: Self-pay | Admitting: Physician Assistant

## 2021-05-06 DIAGNOSIS — R202 Paresthesia of skin: Secondary | ICD-10-CM

## 2021-05-06 DIAGNOSIS — R0989 Other specified symptoms and signs involving the circulatory and respiratory systems: Secondary | ICD-10-CM

## 2021-05-13 ENCOUNTER — Other Ambulatory Visit: Payer: BC Managed Care – PPO

## 2021-06-16 ENCOUNTER — Emergency Department (HOSPITAL_COMMUNITY): Payer: BC Managed Care – PPO

## 2021-06-16 ENCOUNTER — Emergency Department (HOSPITAL_COMMUNITY)
Admission: EM | Admit: 2021-06-16 | Discharge: 2021-06-16 | Disposition: A | Discharge status: Home / Self Care | Payer: BC Managed Care – PPO | Attending: Emergency Medicine | Admitting: Emergency Medicine

## 2021-06-16 DIAGNOSIS — M25561 Pain in right knee: Secondary | ICD-10-CM

## 2021-06-16 DIAGNOSIS — Y92410 Unspecified street and highway as the place of occurrence of the external cause: Secondary | ICD-10-CM | POA: Insufficient documentation

## 2021-06-16 DIAGNOSIS — M25532 Pain in left wrist: Secondary | ICD-10-CM | POA: Diagnosis not present

## 2021-06-16 DIAGNOSIS — R519 Headache, unspecified: Secondary | ICD-10-CM | POA: Diagnosis not present

## 2021-06-16 DIAGNOSIS — M542 Cervicalgia: Secondary | ICD-10-CM | POA: Insufficient documentation

## 2021-06-16 DIAGNOSIS — S8991XA Unspecified injury of right lower leg, initial encounter: Secondary | ICD-10-CM | POA: Diagnosis present

## 2021-06-16 DIAGNOSIS — S80211A Abrasion, right knee, initial encounter: Secondary | ICD-10-CM | POA: Diagnosis not present

## 2021-06-16 DIAGNOSIS — M25531 Pain in right wrist: Secondary | ICD-10-CM | POA: Diagnosis not present

## 2021-06-16 NOTE — ED Notes (Signed)
Pt refused IV start 

## 2021-06-16 NOTE — Discharge Instructions (Addendum)
Please follow-up with your PCP.  Please take Tylenol and ibuprofen as needed for pain. 
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
## 2021-06-16 NOTE — ED Notes (Signed)
Patient transported to CT 
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
